# Patient Record
Sex: Female | Born: 1990 | Hispanic: Yes | Marital: Married | Smoking: Never smoker
Health system: Southern US, Community
[De-identification: ages and names within clinical notes are randomized; demographics above are authoritative.]

## PROBLEM LIST (undated history)

## (undated) DIAGNOSIS — O139 Gestational [pregnancy-induced] hypertension without significant proteinuria, unspecified trimester: Secondary | ICD-10-CM

## (undated) HISTORY — PX: BREAST BIOPSY: SHX20

---

## 2013-07-20 NOTE — L&D Delivery Note (Signed)
Delivery Note At 6:51 AM a viable female was delivered via Vaginal, Spontaneous Delivery (Presentation: Left Occiput Anterior).  APGAR: 4, 7; weight 5 lb 4.7 oz (2401 g).   Placenta status: Intact, Spontaneous.  Cord:  with the following complications: .  Cord pH: not done  Anesthesia:   Episiotomy:  Lacerations:  Suture Repair: 2.0 vicryl Est. Blood Loss (mL):   Mom to postpartum.  Baby to Couplet care / Skin to Skin.  MARSHALL,BERNARD A 01/03/2014, 7:16 AM

## 2013-11-16 LAB — OB RESULTS CONSOLE ABO/RH: RH Type: POSITIVE

## 2013-11-16 LAB — OB RESULTS CONSOLE HEPATITIS B SURFACE ANTIGEN: Hepatitis B Surface Ag: NEGATIVE

## 2013-11-16 LAB — OB RESULTS CONSOLE HIV ANTIBODY (ROUTINE TESTING): HIV: NONREACTIVE

## 2013-11-16 LAB — OB RESULTS CONSOLE RPR: RPR: NONREACTIVE

## 2013-11-16 LAB — OB RESULTS CONSOLE GC/CHLAMYDIA
Chlamydia: NEGATIVE
GC PROBE AMP, GENITAL: NEGATIVE

## 2013-11-16 LAB — OB RESULTS CONSOLE RUBELLA ANTIBODY, IGM: Rubella: IMMUNE

## 2013-11-16 LAB — OB RESULTS CONSOLE ANTIBODY SCREEN: ANTIBODY SCREEN: NEGATIVE

## 2013-12-21 ENCOUNTER — Encounter (HOSPITAL_COMMUNITY): Payer: Self-pay | Admitting: General Practice

## 2013-12-21 ENCOUNTER — Inpatient Hospital Stay (HOSPITAL_COMMUNITY)
Admission: AD | Admit: 2013-12-21 | Discharge: 2013-12-21 | Disposition: A | Discharge status: Home / Self Care | Payer: Self-pay | Source: Ambulatory Visit | Attending: Obstetrics | Admitting: Obstetrics

## 2013-12-21 DIAGNOSIS — O9989 Other specified diseases and conditions complicating pregnancy, childbirth and the puerperium: Principal | ICD-10-CM

## 2013-12-21 DIAGNOSIS — R030 Elevated blood-pressure reading, without diagnosis of hypertension: Secondary | ICD-10-CM | POA: Insufficient documentation

## 2013-12-21 DIAGNOSIS — IMO0001 Reserved for inherently not codable concepts without codable children: Secondary | ICD-10-CM

## 2013-12-21 DIAGNOSIS — O99891 Other specified diseases and conditions complicating pregnancy: Secondary | ICD-10-CM | POA: Insufficient documentation

## 2013-12-21 HISTORY — DX: Gestational (pregnancy-induced) hypertension without significant proteinuria, unspecified trimester: O13.9

## 2013-12-21 LAB — URINALYSIS, ROUTINE W REFLEX MICROSCOPIC
Bilirubin Urine: NEGATIVE
Glucose, UA: NEGATIVE mg/dL
Hgb urine dipstick: NEGATIVE
Ketones, ur: NEGATIVE mg/dL
Leukocytes, UA: NEGATIVE
NITRITE: NEGATIVE
Protein, ur: 30 mg/dL — AB
SPECIFIC GRAVITY, URINE: 1.025 (ref 1.005–1.030)
UROBILINOGEN UA: 0.2 mg/dL (ref 0.0–1.0)
pH: 6 (ref 5.0–8.0)

## 2013-12-21 LAB — URINE MICROSCOPIC-ADD ON

## 2013-12-21 LAB — COMPREHENSIVE METABOLIC PANEL
ALK PHOS: 211 U/L — AB (ref 39–117)
ALT: 20 U/L (ref 0–35)
AST: 17 U/L (ref 0–37)
Albumin: 2.6 g/dL — ABNORMAL LOW (ref 3.5–5.2)
BUN: 11 mg/dL (ref 6–23)
CO2: 19 mEq/L (ref 19–32)
CREATININE: 0.48 mg/dL — AB (ref 0.50–1.10)
Calcium: 8.6 mg/dL (ref 8.4–10.5)
Chloride: 108 mEq/L (ref 96–112)
GFR calc non Af Amer: >90 mL/min (ref >90)
GLUCOSE: 90 mg/dL (ref 70–99)
Potassium: 4.5 mEq/L (ref 3.7–5.3)
Sodium: 137 mEq/L (ref 137–147)
Total Bilirubin: <0.2 mg/dL — ABNORMAL LOW (ref 0.3–1.2)
Total Protein: 6.1 g/dL (ref 6.0–8.3)

## 2013-12-21 LAB — PROTEIN / CREATININE RATIO, URINE
Creatinine, Urine: 128.47 mg/dL
Protein Creatinine Ratio: 0.33 — ABNORMAL HIGH (ref 0.00–0.15)
TOTAL PROTEIN, URINE: 42.3 mg/dL

## 2013-12-21 LAB — CBC
HCT: 38.9 % (ref 36.0–46.0)
HEMOGLOBIN: 13.6 g/dL (ref 12.0–15.0)
MCH: 31.2 pg (ref 26.0–34.0)
MCHC: 35 g/dL (ref 30.0–36.0)
MCV: 89.2 fL (ref 78.0–100.0)
Platelets: 225 10*3/uL (ref 150–400)
RBC: 4.36 MIL/uL (ref 3.87–5.11)
RDW: 14.4 % (ref 11.5–15.5)
WBC: 9.9 10*3/uL (ref 4.0–10.5)

## 2013-12-21 LAB — LACTATE DEHYDROGENASE: LDH: 264 U/L — AB (ref 94–250)

## 2013-12-21 LAB — URIC ACID: URIC ACID, SERUM: 4 mg/dL (ref 2.4–7.0)

## 2013-12-21 NOTE — MAU Provider Note (Signed)
History     CSN: [id number]  Arrival date and time: 12/21/13 1440   First Provider Initiated Contact with Patient 12/21/13 1638      Chief Complaint  Patient presents with  . Hypertension   HPI [name] is a 23 y.o. G2P1001 at [redacted]w[redacted]d who presents to MAU today from the office for further evaluation of elevated BP. The patient denies issues with BP in this pregnancy, but does endorse a history of PIH with last pregnancy. She denies headache, blurred vision, RUQ pain, contractions, vaginal bleeding or LOF. She states mild LE edema bilaterally. She reports good fetal movement.   OB History   Grav Para Term Preterm Abortions TAB SAB Ect Mult Living   2 1 1       1       Past Medical History  Diagnosis Date  . Pregnancy induced hypertension     Past Surgical History  Procedure Laterality Date  . Cesarean section      History reviewed. No pertinent family history.  History  Substance Use Topics  . Smoking status: Never Smoker   . Smokeless tobacco: Not on file  . Alcohol Use: No    Allergies: No Known Allergies  Prescriptions prior to admission  Medication Sig Dispense Refill  . acetaminophen-codeine (TYLENOL #3) 300-30 MG per tablet Take 1 tablet by mouth every 4 (four) hours as needed for moderate pain.      . Prenatal Vit-Fe Fumarate-FA (PRENATAL MULTIVITAMIN) TABS tablet Take 1 tablet by mouth at bedtime.      . ranitidine (ZANTAC) 75 MG tablet Take 75 mg by mouth daily.        Review of Systems  Constitutional: Negative for fever and malaise/fatigue.  Eyes: Negative for blurred vision.  Gastrointestinal: Negative for abdominal pain.  Genitourinary:       Neg - vaginal bleeding, LOF  Neurological: Negative for headaches.   Physical Exam   Blood pressure 135/64, pulse 82, temperature 98.1 F (36.7 C), temperature source Oral, resp. rate 18, height 4' 9.48" (1.46 m), weight 212 lb (96.163 kg).  Physical Exam  Constitutional: She is oriented  to person, place, and time. She appears well-developed and well-nourished. No distress.  HENT:  Head: Normocephalic and atraumatic.  Cardiovascular: Normal rate, regular rhythm and normal heart sounds.   Respiratory: Effort normal and breath sounds normal. No respiratory distress.  GI: Soft. She exhibits no distension and no mass. There is no tenderness. There is no rebound and no guarding.  Musculoskeletal: She exhibits edema (2+ pitting edema in the feet bilaterally).  Neurological: She is alert and oriented to person, place, and time. She has normal reflexes.  No clonus  Skin: Skin is warm and dry. No erythema.  Psychiatric: She has a normal mood and affect.   Results for orders placed during the hospital encounter of 12/21/13 (from the past 24 hour(s))  URINALYSIS, ROUTINE W REFLEX MICROSCOPIC     Status: Abnormal   Collection Time    12/21/13  2:55 PM      Result Value Ref Range   Color, Urine YELLOW  YELLOW   APPearance CLEAR  CLEAR   Specific Gravity, Urine 1.025  1.005 - 1.030   pH 6.0  5.0 - 8.0   Glucose, UA NEGATIVE  NEGATIVE mg/dL   Hgb urine dipstick NEGATIVE  NEGATIVE   Bilirubin Urine NEGATIVE  NEGATIVE   Ketones, ur NEGATIVE  NEGATIVE mg/dL   Protein, ur 30 (*) NEGATIVE mg/dL   Urobilinogen,  UA 0.2  0.0 - 1.0 mg/dL   Nitrite NEGATIVE  NEGATIVE   Leukocytes, UA NEGATIVE  NEGATIVE  URINE MICROSCOPIC-ADD ON     Status: Abnormal   Collection Time    12/21/13  2:55 PM      Result Value Ref Range   Squamous Epithelial / LPF MANY (*) RARE   WBC, UA 0-2  <3 WBC/hpf   RBC / HPF 0-2  <3 RBC/hpf   Bacteria, UA MANY (*) RARE   Crystals CA OXALATE CRYSTALS (*) NEGATIVE   Urine-Other STARCH CRYSTALS PRESENT    PROTEIN / CREATININE RATIO, URINE     Status: Abnormal   Collection Time    12/21/13  2:55 PM      Result Value Ref Range   Creatinine, Urine 128.47     Total Protein, Urine 42.3     PROTEIN CREATININE RATIO 0.33 (*) 0.00 - 0.15  CBC     Status: None    Collection Time    12/21/13  4:43 PM      Result Value Ref Range   WBC 9.9  4.0 - 10.5 K/uL   RBC 4.36  3.87 - 5.11 MIL/uL   Hemoglobin 13.6  12.0 - 15.0 g/dL   HCT 16.138.9  09.636.0 - 04.546.0 %   MCV 89.2  78.0 - 100.0 fL   MCH 31.2  26.0 - 34.0 pg   MCHC 35.0  30.0 - 36.0 g/dL   RDW 40.914.4  81.111.5 - 91.415.5 %   Platelets 225  150 - 400 K/uL  COMPREHENSIVE METABOLIC PANEL     Status: Abnormal   Collection Time    12/21/13  4:43 PM      Result Value Ref Range   Sodium 137  137 - 147 mEq/L   Potassium 4.5  3.7 - 5.3 mEq/L   Chloride 108  96 - 112 mEq/L   CO2 19  19 - 32 mEq/L   Glucose, Bld 90  70 - 99 mg/dL   BUN 11  6 - 23 mg/dL   Creatinine, Ser 7.820.48 (*) 0.50 - 1.10 mg/dL   Calcium 8.6  8.4 - 95.610.5 mg/dL   Total Protein 6.1  6.0 - 8.3 g/dL   Albumin 2.6 (*) 3.5 - 5.2 g/dL   AST 17  0 - 37 U/L   ALT 20  0 - 35 U/L   Alkaline Phosphatase 211 (*) 39 - 117 U/L   Total Bilirubin <0.2 (*) 0.3 - 1.2 mg/dL   GFR calc non Af Amer >90  >90 mL/min   GFR calc Af Amer >90  >90 mL/min  URIC ACID     Status: None   Collection Time    12/21/13  4:43 PM      Result Value Ref Range   Uric Acid, Serum 4.0  2.4 - 7.0 mg/dL  LACTATE DEHYDROGENASE     Status: Abnormal   Collection Time    12/21/13  4:43 PM      Result Value Ref Range   LDH 264 (*) 94 - 250 U/L   Patient Vitals for the past 24 hrs:  BP Temp Temp src Pulse Resp Height Weight  12/21/13 1703 135/64 mmHg - - 82 - - -  12/21/13 1632 131/81 mmHg - - 81 - - -  12/21/13 1618 113/70 mmHg - - 85 - - -  12/21/13 1603 127/72 mmHg - - 77 - - -  12/21/13 1548 137/74 mmHg - - 75 - - -  12/21/13 1533 148/94 mmHg - - 83 - - -  12/21/13 1521 137/75 mmHg - - 84 - - -  12/21/13 1519 142/81 mmHg - - 81 - - -  12/21/13 1509 149/83 mmHg 98.1 F (36.7 C) Oral 81 18 4' 9.48" (1.46 m) 212 lb (96.163 kg)   Fetal Monitoring: Baseline: 120 bpm, moderate variability, + accelerations, no decelerations Contractions: None MAU Course   Procedures None  MDM UA, Urine protein/creatinine ratio, CBC, CMP, Uric acid, LDH today Discussed patient, labs and VS with Dr. Clearance Coots. Ok for discharge with precautions. Follow-up with Dr. Gaynell Face on Monday Assessment and Plan  A: Elevated BP in pregnancy  P: Discharge home Pre-eclampsia precautions discussed Patient advised to call the office for an appointment on Monday for follow-up with Dr. Gaynell Face Patient may return to MAU as needed or if her condition were to change or worsen   Freddi Starr, PA-C  12/21/2013, 5:46 PM

## 2013-12-21 NOTE — MAU Note (Signed)
Pt was sent from office for [facility] evaluation

## 2013-12-21 NOTE — Discharge Instructions (Signed)
Hipertensin durante el embarazo (Hypertension During Pregnancy) La hipertensin tambin se denomina presin arterial alta. La presin arterial hace que se mueva la sangre en el cuerpo. A veces, la fuerza que [employer] sangre es demasiado intensa. Cuando est embarazada, esta afeccin se debe controlar atentamente. Puede causar problemas para usted y su beb. CUIDADOS EN EL HOGAR   Cumpla con todos los controles mdicos.  Tome los [employer] como le indic su mdico. Informe a su mdico sobre todos los medicamentos que toma.  Coma muy poca sal.  Haga ejercicios regularmente.  No beba alcohol.  No fume.  No tome bebidas con cafena.  Acustese sobre el lado izquierdo cuando haga reposo. SOLICITE AYUDA DE INMEDIATO SI:  Siente un dolor intenso en el vientre (abdominal).  Nota una hinchazn repentina [employer], los tobillos o la cara.  Aument 4libras (1,8kg) o ms en 1semana.  Vomita) repetidas veces.  Tiene una hemorragia por la vagina.  No siente los movimientos del beb.  Tiene cefalea.  Tiene visin doble o borrosa.  Tiene calambres o espasmos musculares.  Le falta el aire.  Tiene las yemas de los dedos y los labios [place].  Observa sangre en la orina. ASEGRESE DE QUE:  Comprende estas instrucciones.  Controlar su afeccin.  Recibir ayuda de inmediato si no mejora o si empeora. Document Released: 10/21/2010 Document Revised: 04/26/2013 [facility] Patient Information 2014 [place], [place].

## 2014-01-03 ENCOUNTER — Inpatient Hospital Stay (HOSPITAL_COMMUNITY)
Admission: AD | Admit: 2014-01-03 | Discharge: 2014-01-04 | DRG: 775 | Disposition: A | Discharge status: Home / Self Care | Payer: Medicaid Other | Source: Ambulatory Visit | Attending: Obstetrics | Admitting: Obstetrics

## 2014-01-03 ENCOUNTER — Encounter (HOSPITAL_COMMUNITY): Payer: Self-pay

## 2014-01-03 DIAGNOSIS — O34219 Maternal care for unspecified type scar from previous cesarean delivery: Secondary | ICD-10-CM | POA: Diagnosis present

## 2014-01-03 DIAGNOSIS — O479 False labor, unspecified: Secondary | ICD-10-CM | POA: Diagnosis present

## 2014-01-03 LAB — TYPE AND SCREEN
ABO/RH(D): A POS
Antibody Screen: NEGATIVE

## 2014-01-03 LAB — CBC
HCT: 37.4 % (ref 36.0–46.0)
HEMATOCRIT: 40.9 % (ref 36.0–46.0)
HEMOGLOBIN: 13 g/dL (ref 12.0–15.0)
Hemoglobin: 14.6 g/dL (ref 12.0–15.0)
MCH: 31.7 pg (ref 26.0–34.0)
MCH: 31.3 pg (ref 26.0–34.0)
MCHC: 35.7 g/dL (ref 30.0–36.0)
MCHC: 34.8 g/dL (ref 30.0–36.0)
MCV: 88.7 fL (ref 78.0–100.0)
MCV: 89.9 fL (ref 78.0–100.0)
PLATELETS: 244 10*3/uL (ref 150–400)
Platelets: 208 10*3/uL (ref 150–400)
RBC: 4.61 MIL/uL (ref 3.87–5.11)
RBC: 4.16 MIL/uL (ref 3.87–5.11)
RDW: 14.4 % (ref 11.5–15.5)
RDW: 14.5 % (ref 11.5–15.5)
WBC: 15.6 10*3/uL — ABNORMAL HIGH (ref 4.0–10.5)
WBC: 19.3 10*3/uL — ABNORMAL HIGH (ref 4.0–10.5)

## 2014-01-03 LAB — RPR

## 2014-01-03 LAB — ABO/RH: ABO/RH(D): A POS

## 2014-01-03 MED ORDER — OXYTOCIN 10 UNIT/ML IJ SOLN
40.0000 [IU] | INTRAVENOUS | Status: DC
  Filled 2014-01-03: qty 4

## 2014-01-03 MED ORDER — TETANUS-DIPHTH-ACELL PERTUSSIS 5-2.5-18.5 LF-MCG/0.5 IM SUSP
0.5000 mL | Freq: Once | INTRAMUSCULAR | Status: AC
  Administered 2014-01-04: 0.5 mL via INTRAMUSCULAR
  Filled 2014-01-03: qty 0.5

## 2014-01-03 MED ORDER — IBUPROFEN 600 MG PO TABS
600.0000 mg | ORAL_TABLET | Freq: Four times a day (QID) | ORAL | Status: DC
  Administered 2014-01-03 – 2014-01-04 (×2): 600 mg via ORAL
  Filled 2014-01-03 (×4): qty 1

## 2014-01-03 MED ORDER — METHYLERGONOVINE MALEATE 0.2 MG/ML IJ SOLN
INTRAMUSCULAR | Status: AC
  Administered 2014-01-03: 0.2 mg via INTRAMUSCULAR
  Filled 2014-01-03: qty 1

## 2014-01-03 MED ORDER — IBUPROFEN 600 MG PO TABS
600.0000 mg | ORAL_TABLET | Freq: Four times a day (QID) | ORAL | Status: DC | PRN
  Administered 2014-01-03 – 2014-01-04 (×3): 600 mg via ORAL
  Filled 2014-01-03: qty 1

## 2014-01-03 MED ORDER — DIBUCAINE 1 % RE OINT: 1.0000 "application " | TOPICAL_OINTMENT | RECTAL | Status: DC | PRN

## 2014-01-03 MED ORDER — LIDOCAINE HCL (PF) 1 % IJ SOLN
30.0000 mL | INTRAMUSCULAR | Status: DC | PRN
  Administered 2014-01-03: 30 mL via SUBCUTANEOUS
  Filled 2014-01-03: qty 30

## 2014-01-03 MED ORDER — WITCH HAZEL-GLYCERIN EX PADS: 1.0000 "application " | MEDICATED_PAD | CUTANEOUS | Status: DC | PRN

## 2014-01-03 MED ORDER — OXYTOCIN BOLUS FROM INFUSION
500.0000 mL | INTRAVENOUS | Status: DC
  Administered 2014-01-03: 500 mL via INTRAVENOUS

## 2014-01-03 MED ORDER — FERROUS SULFATE 325 (65 FE) MG PO TABS
325.0000 mg | ORAL_TABLET | Freq: Two times a day (BID) | ORAL | Status: DC
  Administered 2014-01-03 – 2014-01-04 (×2): 325 mg via ORAL
  Filled 2014-01-03 (×2): qty 1

## 2014-01-03 MED ORDER — BENZOCAINE-MENTHOL 20-0.5 % EX AERO
1.0000 "application " | INHALATION_SPRAY | CUTANEOUS | Status: DC | PRN
  Administered 2014-01-03: 1 via TOPICAL
  Filled 2014-01-03: qty 56

## 2014-01-03 MED ORDER — PRENATAL MULTIVITAMIN CH
1.0000 | ORAL_TABLET | Freq: Every day | ORAL | Status: DC
  Administered 2014-01-04: 1 via ORAL
  Filled 2014-01-03: qty 1

## 2014-01-03 MED ORDER — OXYTOCIN 40 UNITS IN LACTATED RINGERS INFUSION - SIMPLE MED
62.5000 mL/h | INTRAVENOUS | Status: DC
  Administered 2014-01-03 (×2): 62.5 mL/h via INTRAVENOUS
  Filled 2014-01-03 (×2): qty 1000

## 2014-01-03 MED ORDER — ONDANSETRON HCL 4 MG/2ML IJ SOLN: 4.0000 mg | INTRAMUSCULAR | Status: DC | PRN

## 2014-01-03 MED ORDER — ONDANSETRON HCL 4 MG/2ML IJ SOLN: 4.0000 mg | Freq: Four times a day (QID) | INTRAMUSCULAR | Status: DC | PRN

## 2014-01-03 MED ORDER — OXYCODONE-ACETAMINOPHEN 5-325 MG PO TABS: 1.0000 | ORAL_TABLET | ORAL | Status: DC | PRN

## 2014-01-03 MED ORDER — SENNOSIDES-DOCUSATE SODIUM 8.6-50 MG PO TABS
2.0000 | ORAL_TABLET | ORAL | Status: DC
  Filled 2014-01-03: qty 2

## 2014-01-03 MED ORDER — DIPHENHYDRAMINE HCL 25 MG PO CAPS: 25.0000 mg | ORAL_CAPSULE | Freq: Four times a day (QID) | ORAL | Status: DC | PRN

## 2014-01-03 MED ORDER — METHYLERGONOVINE MALEATE 0.2 MG/ML IJ SOLN
0.2000 mg | Freq: Once | INTRAMUSCULAR | Status: AC
  Administered 2014-01-03: 0.2 mg via INTRAMUSCULAR

## 2014-01-03 MED ORDER — CITRIC ACID-SODIUM CITRATE 334-500 MG/5ML PO SOLN: 30.0000 mL | ORAL | Status: DC | PRN

## 2014-01-03 MED ORDER — SIMETHICONE 80 MG PO CHEW: 80.0000 mg | CHEWABLE_TABLET | ORAL | Status: DC | PRN

## 2014-01-03 MED ORDER — ONDANSETRON HCL 4 MG PO TABS: 4.0000 mg | ORAL_TABLET | ORAL | Status: DC | PRN

## 2014-01-03 MED ORDER — LANOLIN HYDROUS EX OINT: TOPICAL_OINTMENT | CUTANEOUS | Status: DC | PRN

## 2014-01-03 MED ORDER — FLEET ENEMA 7-19 GM/118ML RE ENEM
1.0000 | ENEMA | RECTAL | Status: DC | PRN
Start: 2014-01-03 — End: 2014-01-03

## 2014-01-03 MED ORDER — ACETAMINOPHEN 325 MG PO TABS: 650.0000 mg | ORAL_TABLET | ORAL | Status: DC | PRN

## 2014-01-03 MED ORDER — LACTATED RINGERS IV SOLN: 500.0000 mL | INTRAVENOUS | Status: DC | PRN

## 2014-01-03 MED ORDER — ZOLPIDEM TARTRATE 5 MG PO TABS: 5.0000 mg | ORAL_TABLET | Freq: Every evening | ORAL | Status: DC | PRN

## 2014-01-03 MED ORDER — METHYLERGONOVINE MALEATE 0.2 MG PO TABS
0.2000 mg | ORAL_TABLET | Freq: Four times a day (QID) | ORAL | Status: DC
  Administered 2014-01-03 – 2014-01-04 (×4): 0.2 mg via ORAL
  Filled 2014-01-03 (×4): qty 1

## 2014-01-03 MED ORDER — LACTATED RINGERS IV SOLN: INTRAVENOUS | Status: DC

## 2014-01-03 MED ORDER — OXYTOCIN 10 UNIT/ML IJ SOLN
INTRAMUSCULAR | Status: AC
  Administered 2014-01-03: 10 [IU] via INTRAMUSCULAR
  Filled 2014-01-03: qty 1

## 2014-01-03 NOTE — H&P (Signed)
This is Dr. Francoise CeoBernard Marshall dictating the history and physical on  Briana Shelton she is a 23 year old gravida 2 para 75100 I'm him with a previous C[place] in [place] 37 weeks 3 days due 75 negative GBS patient was admitted fully dilated her membranes ruptured at 5 AM and the fluid was clear she had a normal vaginal delivery of a female Apgar of 4 and 7 with a tight nuchal cord cut prior to delivery she had first degree him perineal which was repaired with 2-0 Vicryl placenta was spontaneous Past medical history uneventful pregnancy Past surgical history she had a C-section in [place] Social history negative System review negative Physical exam obese female in no distress HEENT negative Lungs clear to P&A Heart regular rhythm no murmurs murmurs no gallops Abdomen uterus 20 week postpartum size Extremities negative

## 2014-01-03 NOTE — Lactation Note (Signed)
This note was copied from the chart of Briana Shelton. Lactation Consultation Note    Initial consult with this mom and baby, now 8 hours post partum. Mom had a PPH of about 600 mls. She was not able to get her first baby to latch. This baby is an early  term at 5737 3/[redacted] weeks gestation.  She weighs 5 lbs 4.7 oz. I assisted mom with latching baby, but mom has flat nipples, and baby is small. I fitted mom wiostrum, after this, I was not able to hand express any additional . Mom demonstrated hadn expression with good technique. Mom then fed 7 mls of 22 cal Neosure formula , and the feeding increase chart was shown to parents, and instructed to start a new day at 8 am.  The plan is for mom ot pump, feed what she expressed, attempt breast feeding if she wants, but limit to about 10 minutes(with NS), and then formula feed. If paarents get too tired tonight, i suggested dad feed baby formula, and mom pump, and feed EBM with next feeding. Spanish interpreter Briana Shelton, was in for the full consult. Mom and dad know how to call for questions/concerns, as needed. [facility].  Patient Name: Briana Shelton Reason for consult: Initial assessment;NICU baby   Maternal Data Formula Feeding for Exclusion: No Infant to breast within first hour of birth: No Breastfeeding delayed due to:: Maternal status Has patient been taught Hand Expression?: Yes Does the patient have breastfeeding experience prior to this delivery?: Yes  Feeding Feeding Type: Breast Milk Length of feed: 10 min  LATCH Score/Interventions Latch: Repeated attempts needed to sustain latch, nipple held in mouth throughout feeding, stimulation needed to elicit sucking reflex. (baby latched shallow with 16 nipple shield, but strong sucks, no milk seen in shield) Intervention(s): Adjust position;Assist with latch;Breast massage;Breast  compression  Audible Swallowing: None  Type of Nipple: Flat  Comfort (Breast/Nipple): Soft / non-tender     Hold (Positioning): Assistance needed to correctly position infant at breast and maintain latch. Intervention(s): Breastfeeding basics reviewed;Support Pillows;Position options;Skin to skin  LATCH Score: 5  Lactation Tools Discussed/Used Tools: Nipple Shields Nipple shield size: 16 WIC Program: Yes (info faxed for DEP - mom also to call to add baby) Pump Review: Setup, frequency, and cleaning;Milk Storage;Other (comment) (premie setting, hand expression, feeding plan) Initiated by:: clee RN LC at 8 hours post partum Date initiated:: 01/03/14   Consult Status Consult Status: Follow-up Date: 01/04/14 Follow-up type: In-patient    Briana Shelton, Briana Shelton, 4:02 PM

## 2014-01-03 NOTE — Progress Notes (Signed)
The patient arrived to me at 1000am. Upon fundal assesement she was trickling. The Land D RN had stated she will let Dr. Gaynell FaceMarshall know that she is still trickling and to see if he wanted any further treatment. I got the patient up to void at 1010 she did well no dizzyness or clots noted. LR with 40 of pit was ordered at 62.5\hr until the am. DR. Gaynell FaceMarshall arrived at 1200 for vaginal exam and expressed a handfull of small clots out of patient and stated that the patient was ok. Will continue to  Monitor patient for bleeding.

## 2014-01-03 NOTE — H&P (Signed)
Briana Shelton is a 23 y.o. female presenting for SROM and UC's. Maternal Medical History:  Reason for admission: Rupture of membranes and contractions.   Fetal activity: Perceived fetal activity is normal.   Last perceived fetal movement was within the past hour.    Prenatal Complications - Diabetes: none.    OB History   Grav Para Term Preterm Abortions TAB SAB Ect Mult Living   2 1 1       1      Past Medical History  Diagnosis Date  . Pregnancy induced hypertension    Past Surgical History  Procedure Laterality Date  . Cesarean section     Family History: family history is not on file. Social History:  reports that she has never smoked. She does not have any smokeless tobacco history on file. She reports that she does not drink alcohol or use illicit drugs.   Prenatal Transfer Tool  Maternal Diabetes: No Genetic Screening: Normal Maternal Ultrasounds/Referrals: Declined Fetal Ultrasounds or other Referrals:  None Maternal Substance Abuse:  No Significant Maternal Medications:  None Significant Maternal Lab Results:  None Other Comments:  None  Review of Systems  All other systems reviewed and are negative.   Dilation: 6 Effacement (%): 80 Station: -3 Exam by:: Judeth HornErin Lawrence RNC Blood pressure 146/94, pulse 71. Maternal Exam:  Uterine Assessment: Contraction strength is firm.  Abdomen: Patient reports no abdominal tenderness. Fetal presentation: vertex  Introitus: Normal vulva. Normal vagina.  Cervix: Cervix evaluated by digital exam.     Physical Exam  Nursing note and vitals reviewed. Constitutional: She is oriented to person, place, and time. She appears well-developed and well-nourished.  HENT:  Head: Normocephalic and atraumatic.  Eyes: Conjunctivae are normal. Pupils are equal, round, and reactive to light.  Neck: Normal range of motion. Neck supple.  Cardiovascular: Normal rate and regular rhythm.   Respiratory: Effort normal.  GI:  Soft.  Musculoskeletal: Normal range of motion.  Neurological: She is alert and oriented to person, place, and time.  Skin: Skin is warm and dry.  Psychiatric: She has a normal mood and affect. Her behavior is normal. Judgment and thought content normal.    Prenatal labs: ABO, Rh:   Antibody:   Rubella:   RPR:    HBsAg:    HIV:    GBS:     Assessment/Plan: 37.3 weeks.  VBAC.  Active labor.  Admit.   HARPER,CHARLES A 01/03/2014, 6:22 AM

## 2014-01-04 ENCOUNTER — Ambulatory Visit: Payer: Self-pay

## 2014-01-04 LAB — CBC
HCT: 33 % — ABNORMAL LOW (ref 36.0–46.0)
Hemoglobin: 11.1 g/dL — ABNORMAL LOW (ref 12.0–15.0)
MCH: 30.6 pg (ref 26.0–34.0)
MCHC: 33.6 g/dL (ref 30.0–36.0)
MCV: 90.9 fL (ref 78.0–100.0)
Platelets: 166 10*3/uL (ref 150–400)
RBC: 3.63 MIL/uL — AB (ref 3.87–5.11)
RDW: 14.8 % (ref 11.5–15.5)
WBC: 12.7 10*3/uL — AB (ref 4.0–10.5)

## 2014-01-04 NOTE — Discharge Summary (Signed)
Obstetric Discharge Summary Reason for Admission: onset of labor Prenatal Procedures: none Intrapartum Procedures: spontaneous vaginal delivery Postpartum Procedures: none Complications-Operative and Postpartum: none Hemoglobin  Date Value Ref Range Status  01/04/2014 11.1* 12.0 - 15.0 g/dL Final     HCT  Date Value Ref Range Status  01/04/2014 33.0* 36.0 - 46.0 % Final    Physical Exam:  General: alert Lochia: appropriate Uterine Fundus: firm Incision: healing well DVT Evaluation: No evidence of DVT seen on physical exam.  Discharge Diagnoses: Term Pregnancy-delivered  Discharge Information: Date: 01/04/2014 Activity: pelvic rest Diet: routine Medications: Percocet Condition: improved Instructions: refer to practice specific booklet Discharge to: home Follow-up Information   Follow up with Kathreen CosierMARSHALL,BERNARD A, MD.   Specialty:  Obstetrics and Gynecology   Contact information:   [address] [place] [address] [phone]       Newborn Data: Live born female  Birth Weight: 5 lb 4.7 oz (2401 g) APGAR: 4, 7  Home with mother.  MARSHALL,BERNARD A 01/04/2014, 6:58 AM

## 2014-01-04 NOTE — Discharge Instructions (Signed)
Discharge instructions   You can wash your hair  Shower  Eat what you want  Drink what you want  See me in 6 weeks  Your ankles are going to swell more in the next 2 weeks than when pregnant  No sex for 6 weeks   Briana Shelton A, MD 01/04/2014

## 2014-01-04 NOTE — Lactation Note (Signed)
This note was copied from the chart of Briana Fabiola Shelton. Lactation Consultation Note  Patient Name: Briana Shelton UEAVW'UToday's Date: 01/04/2014 Reason for consult: Follow-up assessment;Breast/nipple pain.  RN had requested comfort gelpads earlier today and provided these to patient.  At time of LC visit, mom resting but FOB speaks English and reports that mom has been following recommendations of previous LC:  Breastfeeding first, pumping with DEBP and feeding either ebm or formula after breastfeeding, as needed.  FOB is staying tonight, so LC encouraged him to assist mom with continuing plan.   Maternal Data    Feeding    LATCH Score/Interventions        LATCH score=6 at 1500 feeding, per RN, due to need for repeated assistance, stimulation and limited swallows              Lactation Tools Discussed/Used Tools: Comfort gels   Consult Status Consult Status: Follow-up Date: 01/05/14 Follow-up type: In-patient    Warrick ParisianBryant, Joanne [facility] 01/04/2014, 10:18 PM

## 2014-01-04 NOTE — Progress Notes (Signed)
UR chart review completed.  

## 2014-01-04 NOTE — Lactation Note (Signed)
This note was copied from the chart of Briana Shelton. Lactation Consultation Note  Patient Name: Briana Shelton ZOXWR'UToday's Date: 01/04/2014 Reason for consult: Follow-up assessment Baby 30 hours of life. Assisted by in-house Spanish interpreter, Kandis CockingMaritza. Mom reports that she has just BF baby 20 minutes and is now supplementing baby with formula in bottle. Mom reports that she was able to obtain EBM with DEBP yesterday, but is not able to get anything today. Mom reports that her first baby was early and little also, and her milk came in day 3. Mom reports that she used a syringe to pump and help evert her nipple with first baby, and did not use a NS. Gave mom a hand pump to assist with everting her nipple. Assisted mom to hand express, colostrum expressed from both breast. Plan is for mom to offer lots of STS, massage and hand express prior to offering breast to infant with cues and at least 8-12 times/24 hours. After baby nurses, mom uses DEBP 15 minutes while FOB offers baby bottle and mom watches baby not pump, then hand express after pumping. Enc mom to try to limit each feeding to 30 minutes not to tire baby out. Discussed plan with patient's MBU RN, and other LC as enc parents [facility] latch when baby since baby was receiving bottle at this assessment.   Maternal Data    Feeding Feeding Type:  (Mom supplementing baby with bottle.)  LATCH Score/Interventions Latch: Repeated attempts needed to sustain latch, nipple held in mouth throughout feeding, stimulation needed to elicit sucking reflex. Intervention(s): Adjust position  Audible Swallowing: A few with stimulation  Type of Nipple: Flat  Comfort (Breast/Nipple): Soft / non-tender     Hold (Positioning): Assistance needed to correctly position infant at breast and maintain latch.  LATCH Score: 6  Lactation Tools Discussed/Used Tools: Pump Breast pump type: Manual   Consult  Status Consult Status: Follow-up Date: 01/05/14 Follow-up type: In-patient    Geralynn OchsWILLIARD, JENNIFER 01/04/2014, 12:54 PM

## 2014-01-05 ENCOUNTER — Ambulatory Visit: Payer: Self-pay

## 2014-01-05 NOTE — Lactation Note (Addendum)
This note was copied from the chart of Girl Fabiola Hernandez-Parada. Lactation Consultation Note With Dad interpreting. Attempt to latch baby but too sleepy- would take a few sucks then go off to sleep. Reports that baby had about 50 cc's of formula at the last feeding. Mom has called WIC and plans to get pump from them. No questions at present. Concerned that she is not getting enough milk. Encouragement given to pump if baby does not nurse to promote milk supply. To call prn.  Patient Name: Girl Sindy GuadeloupeFabiola Hernandez-Parada ZOXWR'UToday's Date: 01/05/2014 Reason for consult: Follow-up assessment;Late preterm infant   Maternal Data    Feeding Feeding Type: Breast Fed Length of feed: 5 min  LATCH Score/Interventions Latch: Repeated attempts needed to sustain latch, nipple held in mouth throughout feeding, stimulation needed to elicit sucking reflex.  Audible Swallowing: None  Type of Nipple: Everted at rest and after stimulation  Comfort (Breast/Nipple): Soft / non-tender     Hold (Positioning): Assistance needed to correctly position infant at breast and maintain latch. Intervention(s): Breastfeeding basics reviewed;Support Pillows  LATCH Score: 6  Lactation Tools Discussed/Used WIC Program: Yes   Consult Status Consult Status: Complete    Pamelia HoitWeeks, Sharifah Champine D 01/05/2014, 8:55 AM

## 2014-01-06 ENCOUNTER — Ambulatory Visit: Payer: Self-pay

## 2014-01-06 NOTE — Lactation Note (Signed)
This note was copied from the chart of Briana Fabiola Shelton. Lactation Consultation Note  Patient Name: Briana Shelton RUEAV'WToday's Date: 01/06/2014 Reason for consult: Follow-up assessment;Infant < 6lbs Shanda BumpsJessica, the Spanish interpreter present for visit. Mom and FOB report baby is latching well to both breasts using the #16 nipple shield. Mom reports her breasts are beginning to fill. She is using the hand pump to pre-pump. Mom is breast and bottle feeding. She reports her plan is to BF with each feeding, then pump and give baby back EBM, using formula as needed to satisfy baby. LC encouraged Mom to be sure baby is at the breast 8-12 times in 24 hours, keep baby actively nursing for 15-20 minutes each breast if possible, then post pump and continue to give EBM back to baby till back to birth weight. Mom can apply nipple shield independently. [facility] parents [facility]. Engorgement care reviewed if needed. Advised of OP f/u and recommend scheduling an appointment. Parents will call if want appt. Baby latched well to the left breast this visit with #16 nipple shield. Took a few minutes to organize her suck but once she developed a good suckling pattern, audible swallows were present and Mom's breast milk present in nipple shield. Baby sleepy when tried to latch to right breast and would not suckle. Continue plan as above.   Maternal Data    Feeding Feeding Type: Breast Fed Length of feed: 10 min  LATCH Score/Interventions Latch: Grasps breast easily, tongue down, lips flanged, rhythmical sucking. (using #16 nipple shield)  Audible Swallowing: Spontaneous and intermittent  Type of Nipple: Flat Intervention(s): Hand pump;Double electric pump  Comfort (Breast/Nipple): Filling, red/small blisters or bruises, mild/mod discomfort  Problem noted: Filling  Hold (Positioning): Assistance needed to correctly position infant at breast and maintain  latch. Intervention(s): Breastfeeding basics reviewed;Support Pillows;Position options;Skin to skin  LATCH Score: 7  Lactation Tools Discussed/Used Nipple shield size: 16 Breast pump type: Double-Electric Breast Pump   Consult Status Consult Status: Complete Date: 01/06/14 Follow-up type: In-patient    Alfred LevinsGranger, Kathy Ann 01/06/2014, 12:05 PM

## 2014-05-21 ENCOUNTER — Encounter (HOSPITAL_COMMUNITY): Payer: Self-pay

## 2016-01-22 ENCOUNTER — Ambulatory Visit (HOSPITAL_COMMUNITY)
Admission: EM | Admit: 2016-01-22 | Discharge: 2016-01-22 | Disposition: A | Discharge status: Home / Self Care | Payer: Self-pay | Attending: Emergency Medicine | Admitting: Emergency Medicine

## 2016-01-22 ENCOUNTER — Encounter (HOSPITAL_COMMUNITY): Payer: Self-pay | Admitting: Emergency Medicine

## 2016-01-22 DIAGNOSIS — J0100 Acute maxillary sinusitis, unspecified: Secondary | ICD-10-CM

## 2016-01-22 MED ORDER — AMOXICILLIN-POT CLAVULANATE 875-125 MG PO TABS
1.0000 | ORAL_TABLET | Freq: Two times a day (BID) | ORAL | Status: DC
Start: 2016-01-22 — End: 2019-12-13

## 2016-01-22 NOTE — Discharge Instructions (Signed)
Sinusitis en adultos (Sinusitis, Adult) La sinusitis es el enrojecimiento, el dolor y la inflamacin de los senos paranasales. Los senos paranasales son cavidades de aire que estn dentro de los huesos de la cara. Se encuentran debajo de los ojos, en la mitad de la frente y encima de los ojos. En los senos paranasales sanos, el moco puede drenar y el aire circula a travs de ellos en su camino hacia la Clinical cytogeneticistnariz. Sin embargo, cuando se Socasteeinflaman, el moco y el aire Marionquedan atrapados. Esto hace que se desarrollen bacterias y otros grmenes que causan infeccin. La sinusitis puede desarrollarse rpidamente y durar solo un tiempo corto (aguda) o continuar por un perodo largo (crnica). La sinusitis que dura ms de 12 semanas se considera crnica. CAUSAS Las causas de la sinusitis son:  GladstoneAlergias.  Las Liz Claiborneanomalas estructurales, como el desplazamiento del cartlago que separa las fosas nasales (desvo del tabique), que puede disminuir el flujo de aire por la nariz y los senos paranasales, y Audiological scientistafectar su drenaje.  Las anomalas funcionales, como cuando los pequeos pelos (cilias) que se encuentran en los senos paranasales y ayudan a eliminar el moco no funcionan correctamente o no estn presentes. SIGNOS Y SNTOMAS Los sntomas de la sinusitis aguda y Myanmarcrnica son los mismos. Los sntomas principales son Chief Technology Officerel dolor y la presin alrededor de los senos paranasales afectados. Otros sntomas son:  Careers information officerDolor en los dientes superiores.  Dolor de odos.  Dolor de Turkmenistancabeza.  Mal aliento.  Disminucin del sentido del olfato y del gusto.  Tos, que empeora al D.R. Horton, Incacostarse.  Fatiga.  Grant RutsFiebre.  Drenaje de moco espeso por la nariz, que generalmente es de color verde y puede contener pus (purulento).  Hinchazn y calor en los senos paranasales afectados. DIAGNSTICO Su mdico le Medical sales representativerealizar un examen fsico. Durante el examen, el mdico puede hacer cualquiera de estas cosas para determinar si usted tiene sinusitis aguda o  crnica:  Le revisar la nariz para buscar signos de crecimientos anormales en las fosas nasales (plipos nasales).  Palpar los senos paranasales afectados para buscar signos de infeccin.  Observar la parte interna de los senos paranasales con un dispositivo que tiene una luz (endoscopio). Si el mdico sospecha que usted sufre sinusitis crnica, podr indicar una o ms de las siguientes pruebas:  Pruebas de Programmer, multimediaalergia.  Cultivo de las Yahoosecreciones nasales. Se extrae Lauris Poaguna muestra de moco de la nariz, que se enva al laboratorio para detectar bacterias.  Citologa nasal. Se extrae Lauris Poaguna muestra de moco de la nariz, que el mdico examina para determinar si la sinusitis est relacionada con Vella Raringuna alergia. TRATAMIENTO La mayora de los casos de sinusitis aguda se deben a una infeccin viral y se resuelven espontneamente en un perodo de 10das. A veces, se recetan medicamentos como ayuda para Eastman Kodakaliviar los sntomas tanto de la sinusitis aguda como de la crnica. Estos pueden incluir analgsicos, descongestivos, aerosoles nasales con corticoides o aerosoles de solucin salina. Sin embargo, para la sinusitis por infeccin bacteriana, Chief Operating Officerel mdico le recetar antibiticos. Los antibiticos son medicamentos que destruyen las bacterias que causan la infeccin. Con poca frecuencia, la sinusitis tiene su origen en una infeccin por hongos. En estos casos, Actorel mdico le recetar un medicamento antimictico. Para algunos casos de sinusitis crnica, es necesario someterse a [place] and Herzegovinauna ciruga. Generalmente, se trata de Engelhard Corporationcasos en los que la sinusitis se repite ms de 3veces al ao, a pesar de otros tratamientos. INSTRUCCIONES PARA EL CUIDADO EN EL HOGAR  Beber abundante agua. Los lquidos ayudan a Restaurant manager, fast fooddisolver  el moco, para que drene ms fcilmente de los senos paranasales.  Use un humidificador.  Inhale vapor de 3a 4veces al da (por ejemplo, sintese en el bao con la [place]).  Aplquese un pao tibio y hmedo en  la cara 3 o 4veces al da o segn las indicaciones del mdico.  Use un aerosol nasal salino para ayudar a Environmental education officer y [employer] senos nasales.  Tome los medicamentos solamente como se lo haya indicado el mdico.  Si le recetaron un antimictico o un antibitico, asegrese de terminarlos, incluso si comienza a sentirse mejor. SOLICITE ATENCIN MDICA DE INMEDIATO SI:  Siente ms dolor o sufre dolores de cabeza intensos.  Tiene nuseas, vmitos o somnolencia.  Observa hinchazn alrededor del rostro.  Tiene problemas de visin.  Presenta rigidez en el cuello.  Tiene dificultad para respirar.   Esta informacin no tiene Theme park manager el consejo del mdico. Asegrese de hacerle al mdico cualquier pregunta que tenga.   Document Released: 04/15/2005 Document Revised: 07/27/2014 Elsevier Interactive Patient Education 2016 [employer].  Dolor de cabeza general sin causa (General Headache Without Cause) El dolor de cabeza es un dolor o Dentist que se siente en la zona de la cabeza o del cuello. Puede no tener una causa especfica. Hay muchas causas y tipos de dolores de [place]. Los dolores de cabeza ms comunes son los siguientes:  Cefalea tensional.  Cefaleas migraosas.  Cefalea en brotes.  Cefaleas diarias crnicas. INSTRUCCIONES PARA EL CUIDADO EN EL HOGAR  Controle su afeccin para ver si hay cambios. Siga estos pasos para Scientist, physiological afeccin: Control del [employer] medicamentos de venta libre y los recetados solamente como se lo haya indicado el mdico.  Cuando sienta dolor de cabeza acustese en un cuarto oscuro y tranquilo.  Si se lo indican, aplique hielo sobre la cabeza y la zona del cuello:  Ponga el hielo en una bolsa plstica.  Coloque una toalla entre la piel y la bolsa de hielo.  Coloque el hielo durante , 2 a 3veces por Futures trader.  Utilice una almohadilla trmica o tome una ducha con agua caliente para aplicar calor en la cabeza y la  zona del cuello como se lo haya indicado el mdico.  Mantenga las luces tenues si le Liz Claiborne luces brillantes o sus dolores de cabeza empeoran. Comida y bebida  Mantenga un horario para las comidas.  Limite el consumo de bebidas alcohlicas.  Consuma menos cantidad de cafena o deje de tomarla. Instrucciones generales  Concurra a todas las visitas de control como se lo haya indicado el mdico. Esto es importante.  Lleve un diario de los dolores de cabeza para Financial risk analyst qu factores pueden desencadenarlos. Por ejemplo, escriba los siguientes datos:  Lo que usted come y Estate agent.  Cunto tiempo duerme.  Algn cambio en su dieta o en los medicamentos.  Pruebe algunas tcnicas de relajacin, como los [place].  Limite el estrs.  Sintese con la espalda recta y no tense los msculos.  No consuma productos que contengan tabaco, incluidos cigarrillos, tabaco de Theatre manager o cigarrillos electrnicos. Si necesita ayuda para dejar de fumar, consulte al mdico.  Haga actividad fsica habitualmente como se lo haya indicado el mdico.  Tenga un horario fijo para dormir. Duerma entre 7 y 9horas o la cantidad de horas que le haya recomendado el mdico. SOLICITE ATENCIN MDICA SI:   Los medicamentos no Materials engineer los sntomas.  Tiene un dolor de cabeza que es diferente del dolor de  cabeza habitual.  Tiene nuseas o vmitos.  Tiene fiebre. SOLICITE ATENCIN MDICA DE INMEDIATO SI:   El dolor se hace cada vez ms intenso.  Ha vomitado repetidas veces.  Presenta rigidez en el cuello.  Sufre prdida de la visin.  Tiene problemas para hablar.  Siente dolor en el ojo o en el odo.  Presenta debilidad muscular o prdida del control muscular.  Pierde el equilibrio o tiene problemas para Advertising account plannercaminar.  Sufre mareos o se desmaya.  Se siente confundido.   Esta informacin no tiene Theme park managercomo fin reemplazar el consejo del mdico. Asegrese de hacerle al mdico cualquier pregunta que  tenga.   Document Released: 04/15/2005 Document Revised: 03/27/2015 Elsevier Interactive Patient Education Yahoo[employer]2016 Elsevier Inc.

## 2016-01-22 NOTE — ED Notes (Signed)
[facility] a [facility] and a headache x 1 week.

## 2016-01-22 NOTE — ED Provider Notes (Signed)
CSN: [id number]     Arrival date & time 01/22/16  0957 History   First MD Initiated Contact with Patient 01/22/16 1021     Chief Complaint  Patient presents with  . Nasal Congestion  . Headache   (Consider location/radiation/quality/duration/timing/severity/associated sxs/prior Treatment) HPI History obtained from patient/with Spanish line interpreter Location:  Sinuses Context/Duration: Onset about 2 weeks ago after a cold  Severity: 4  Quality: Constant pressure Timing:          Constant  Home Treatment: Over-the-counter medications without relief of symptoms Associated symptoms:  Headache Family History: Hypertension    Past Medical History  Diagnosis Date  . Pregnancy induced hypertension    Past Surgical History  Procedure Laterality Date  . Cesarean section     History reviewed. No pertinent family history. Social History  Substance Use Topics  . Smoking status: Never Smoker   . Smokeless tobacco: None  . Alcohol Use: No   OB History    Gravida Para Term Preterm AB TAB SAB Ectopic Multiple Living   2 2 2       2      Review of Systems  Denies: HEADACHE, NAUSEA, ABDOMINAL PAIN, CHEST PAIN, CONGESTION, DYSURIA, SHORTNESS OF BREATH  Allergies  Review of patient's allergies indicates no known allergies.  Home Medications   Prior to Admission medications   Medication Sig Start Date End Date Taking? Authorizing Provider  amoxicillin-clavulanate (AUGMENTIN) 875-125 MG tablet Take 1 tablet by mouth every 12 (twelve) hours. 01/22/16   Tharon AquasFrank C Marita Burnsed, PA   Meds Ordered and Administered this Visit  Medications - No data to display  BP 138/82 mmHg  Pulse 98  Temp(Src) 97.7 F (36.5 C) (Oral)  Resp 18  SpO2 99%  Breastfeeding? No No data found.   Physical Exam NURSES NOTES AND VITAL SIGNS REVIEWED. CONSTITUTIONAL: Well developed, well nourished, no acute distress HEENT: normocephalic, atraumatic, left maxillary sinus exquisitely tender to palpation. There is  bogginess of the left turbinate. Decreased transillumination. Patient has nasal occlusion on the left. It is also a small amount of postnasal drainage noted. EYES: Conjunctiva normal NECK:normal ROM, supple, no adenopathy PULMONARY:No respiratory distress, normal effort ABDOMINAL: Soft, ND, NT BS+, No CVAT MUSCULOSKELETAL: Normal ROM of all extremities,  SKIN: warm and dry without rash PSYCHIATRIC: Mood and affect, behavior are normal   ED Course  Procedures (including critical care time)  Labs Review Labs Reviewed - No data to display  Imaging Review No results found.   Visual Acuity Review  Right Eye Distance:   Left Eye Distance:   Bilateral Distance:    Right Eye Near:   Left Eye Near:    Bilateral Near:       RX augmentin  MDM   1. Acute maxillary sinusitis, recurrence not specified     Patient is reassured that there are no issues that require transfer to higher level of care at this time or additional tests. Patient is advised to continue home symptomatic treatment. Patient is advised that if there are new or worsening symptoms to attend the emergency department, contact primary care provider, or return to UC. Instructions of care provided discharged home in stable condition.    THIS NOTE WAS GENERATED USING A VOICE RECOGNITION SOFTWARE PROGRAM. ALL REASONABLE EFFORTS  WERE MADE TO PROOFREAD THIS DOCUMENT FOR ACCURACY.  I have verbally reviewed the discharge instructions with the patient. A printed AVS was given to the patient.  All questions were answered prior to discharge.  Tharon AquasFrank C Yarlin Breisch, PA 01/22/16 1400

## 2016-07-17 ENCOUNTER — Ambulatory Visit (HOSPITAL_COMMUNITY)
Admission: EM | Admit: 2016-07-17 | Discharge: 2016-07-17 | Disposition: A | Discharge status: Home / Self Care | Payer: Self-pay | Attending: Family Medicine | Admitting: Family Medicine

## 2016-07-17 ENCOUNTER — Encounter (HOSPITAL_COMMUNITY): Payer: Self-pay | Admitting: Emergency Medicine

## 2016-07-17 DIAGNOSIS — J0100 Acute maxillary sinusitis, unspecified: Secondary | ICD-10-CM

## 2016-07-17 MED ORDER — AMOXICILLIN 500 MG PO CAPS: 500.0000 mg | ORAL_CAPSULE | Freq: Three times a day (TID) | ORAL | 0 refills | Status: DC

## 2016-07-17 NOTE — ED Triage Notes (Signed)
Here for constant HA onset 1 week associated w/bilateral ear pain, nasal congestion  Denies fevers  Using Afrin w/no relief  A&O x4... NAD

## 2016-07-17 NOTE — ED Provider Notes (Signed)
CSN: [id number]     Arrival date & time 07/17/16  1630 History   None    Chief Complaint  Patient presents with  . Headache   (Consider location/radiation/quality/duration/timing/severity/associated sxs/prior Treatment) The history is provided by the patient. No language interpreter was used.  Headache  Pain location:  Generalized Radiates to:  Face Timing:  Constant Progression:  Worsening Chronicity:  New Context: coughing   Ineffective treatments:  None tried Associated symptoms: sinus pressure   Pt complains of a cough and sinus pressure.  Pt feels like she has a sinus infection  Past Medical History:  Diagnosis Date  . Pregnancy induced hypertension    Past Surgical History:  Procedure Laterality Date  . CESAREAN SECTION     No family history on file. Social History  Substance Use Topics  . Smoking status: Never Smoker  . Smokeless tobacco: Never Used  . Alcohol use No   OB History    Gravida Para Term Preterm AB Living   2 2 2     2    SAB TAB Ectopic Multiple Live Births           2     Review of Systems  HENT: Positive for sinus pressure.   Neurological: Positive for headaches.  All other systems reviewed and are negative.   Allergies  Patient has no known allergies.  Home Medications   Prior to Admission medications   Medication Sig Start Date End Date Taking? Authorizing Provider  amoxicillin (AMOXIL) 500 MG capsule Take 1 capsule (500 mg total) by mouth 3 (three) times daily. 07/17/16   Elson AreasLeslie K Creedon Danielski, PA-C  amoxicillin-clavulanate (AUGMENTIN) 875-125 MG tablet Take 1 tablet by mouth every 12 (twelve) hours. 01/22/16   Tharon AquasFrank C Patrick, PA   Meds Ordered and Administered this Visit  Medications - No data to display  BP 114/84 (BP Location: Right Arm)   Pulse 87   Temp 98.8 F (37.1 C) (Oral)   SpO2 100%  No data found.   Physical Exam  Constitutional: She is oriented to person, place, and time. She appears well-developed and  well-nourished.  HENT:  Head: Normocephalic and atraumatic.  Mouth/Throat: Posterior oropharyngeal erythema present.  Tender maxillary sinuses,  Eyes: Conjunctivae and EOM are normal. Pupils are equal, round, and reactive to light.  Neck: Normal range of motion. Neck supple.  Pulmonary/Chest: Effort normal.  Abdominal: Soft.  Musculoskeletal: Normal range of motion.  Neurological: She is alert and oriented to person, place, and time.  Skin: Skin is warm and dry.  Psychiatric: She has a normal mood and affect.    Urgent Care Course   Clinical Course     Procedures (including critical care time)  Labs Review Labs Reviewed - No data to display  Imaging Review No results found.   Visual Acuity Review  Right Eye Distance:   Left Eye Distance:   Bilateral Distance:    Right Eye Near:   Left Eye Near:    Bilateral Near:         MDM   1. Acute maxillary sinusitis, recurrence not specified    Meds ordered this encounter  Medications  . amoxicillin (AMOXIL) 500 MG capsule    Sig: Take 1 capsule (500 mg total) by mouth 3 (three) times daily.    Dispense:  30 capsule    Refill:  0    Order Specific Question:   Supervising Provider    Answer:   Linna HoffKINDL, JAMES D [phone]]  An After  Visit Summary was printed and given to the patient.    Lonia SkinnerLeslie K PaloSofia, PA-C 07/17/16 [phone]

## 2017-02-17 ENCOUNTER — Encounter (HOSPITAL_COMMUNITY): Payer: Self-pay

## 2019-07-04 ENCOUNTER — Other Ambulatory Visit (HOSPITAL_COMMUNITY): Payer: Self-pay | Admitting: *Deleted

## 2019-07-04 DIAGNOSIS — N6320 Unspecified lump in the left breast, unspecified quadrant: Secondary | ICD-10-CM

## 2019-07-06 ENCOUNTER — Other Ambulatory Visit (HOSPITAL_COMMUNITY): Payer: Self-pay | Admitting: Obstetrics and Gynecology

## 2019-07-06 ENCOUNTER — Ambulatory Visit
Admission: RE | Admit: 2019-07-06 | Discharge: 2019-07-06 | Disposition: A | Discharge status: Home / Self Care | Payer: No Typology Code available for payment source | Source: Ambulatory Visit | Attending: Obstetrics and Gynecology | Admitting: Obstetrics and Gynecology

## 2019-07-06 ENCOUNTER — Ambulatory Visit (HOSPITAL_COMMUNITY)
Admission: RE | Admit: 2019-07-06 | Discharge: 2019-07-06 | Disposition: A | Discharge status: Home / Self Care | Payer: Self-pay | Source: Ambulatory Visit | Attending: Obstetrics and Gynecology | Admitting: Obstetrics and Gynecology

## 2019-07-06 ENCOUNTER — Other Ambulatory Visit: Payer: Self-pay

## 2019-07-06 ENCOUNTER — Encounter (HOSPITAL_COMMUNITY): Payer: Self-pay

## 2019-07-06 DIAGNOSIS — N6320 Unspecified lump in the left breast, unspecified quadrant: Secondary | ICD-10-CM

## 2019-07-06 DIAGNOSIS — Z1239 Encounter for other screening for malignant neoplasm of breast: Secondary | ICD-10-CM | POA: Insufficient documentation

## 2019-07-06 DIAGNOSIS — N6325 Unspecified lump in the left breast, overlapping quadrants: Secondary | ICD-10-CM | POA: Insufficient documentation

## 2019-07-06 DIAGNOSIS — N611 Abscess of the breast and nipple: Secondary | ICD-10-CM

## 2019-07-06 NOTE — Progress Notes (Signed)
Complaints of left breast lump x one week that is red, warm, and painful to touch. Patient stated the redness has improved since she was given antibiotics 07/04/2019. Patient rates the pain at a 5 out of 10.  Pap Smear: Pap smear not completed today. Last Pap smear was 02/17/2017 at the [facility] and normal. Per patient has no history of an abnormal Pap smear. Last Pap smear result will be scanned into Epic.  Physical exam: Breasts Breasts symmetrical. No skin abnormalities right breast. Reddened area observed left breast at 9 o'clock next to areola. No nipple retraction right breast. Left nipple slightly inverted that per patient is normal for her. No nipple discharge bilateral breasts. No lymphadenopathy. No lumps palpated bilateral right breast. Palpated a lump within the left breast at 9 o'clock next to nipple. No complaints of pain or tenderness on exam. Referred patient to the [facility] for a left breast ultrasound. Appointment scheduled for Thursday, July 06, 2019 at 1010.        Pelvic/Bimanual No Pap smear completed today since last Pap smear was 02/17/2017. Pap smear not indicated per BCCCP guidelines.   Smoking History: Patient has never smoked.  Patient Navigation: Patient education provided. Access to services provided for patient through [facility] program. Spanish interpreter provided.   Breast and Cervical Cancer Risk Assessment: Patient has a family history of her mother having breast cancer. Patient has no known genetic mutations or history of radiation treatment to the chest before age 82. Patient has no history of cervical dysplasia, immunocompromised, or DES exposure in-utero. Breast cancer risk assessment completed. No breast cancer risk calculated due to patient is less than 25 years old.  Used Spanish interpreter [employer] from [place].

## 2019-07-06 NOTE — Patient Instructions (Signed)
Explained breast self awareness with Briana Shelton. Patient did not need a Pap smear today due to last Pap smear was 02/17/2017. Let her know BCCCP will cover Pap smears every 3 years unless has a history of abnormal Pap smears. Referred patient to the [facility] for a left breast ultrasound. Appointment scheduled for Thursday, July 06, 2019 at 1010. Patient aware of appointment and will be there. Briana Shelton verbalized understanding.  Askia Hazelip, Arvil Chaco, RN 8:18 AM

## 2019-07-17 ENCOUNTER — Ambulatory Visit
Admission: RE | Admit: 2019-07-17 | Discharge: 2019-07-17 | Disposition: A | Discharge status: Home / Self Care | Payer: No Typology Code available for payment source | Source: Ambulatory Visit | Attending: Obstetrics and Gynecology | Admitting: Obstetrics and Gynecology

## 2019-07-17 ENCOUNTER — Other Ambulatory Visit: Payer: Self-pay

## 2019-07-17 ENCOUNTER — Other Ambulatory Visit (HOSPITAL_COMMUNITY): Payer: Self-pay | Admitting: Obstetrics and Gynecology

## 2019-07-17 DIAGNOSIS — N611 Abscess of the breast and nipple: Secondary | ICD-10-CM

## 2019-08-08 ENCOUNTER — Other Ambulatory Visit: Payer: Self-pay

## 2019-08-08 ENCOUNTER — Ambulatory Visit
Admission: RE | Admit: 2019-08-08 | Discharge: 2019-08-08 | Disposition: A | Discharge status: Home / Self Care | Payer: No Typology Code available for payment source | Source: Ambulatory Visit | Attending: Obstetrics and Gynecology | Admitting: Obstetrics and Gynecology

## 2019-08-08 DIAGNOSIS — N611 Abscess of the breast and nipple: Secondary | ICD-10-CM

## 2019-10-04 ENCOUNTER — Encounter: Payer: Self-pay | Admitting: *Deleted

## 2019-11-17 ENCOUNTER — Ambulatory Visit: Payer: Self-pay | Admitting: Surgery

## 2019-11-17 NOTE — H&P (Signed)
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 She was last seen in February 2021 after being seen for a left breast abscess. This Better concern. She's had some intermittent drainage from what appears to be a small fistulous site of the left breast. A translator is used today for our visit. She has no significant pain but the area keeps straining. There is no fever or chills redness or warmth.  The patient is a 29 year old female.   Medication History (Armen Ferguson, CMA; 11/17/2019 10:49 AM) No Current Medications Medications Reconciled    Vitals (Armen Ferguson CMA; 11/17/2019 10:49 AM) 11/17/2019 10:48 AM Weight: 188.5 lb Height: 62in Body Surface Area: 1.86 m Body Mass Index: 34.48 kg/m  Temp.: 97.40F  Pulse: 84 (Regular)  P.OX: 99% (Room air) BP: 124/88(Sitting, Left Arm, Standard)        Physical Exam (Fawaz Borquez A. Lorenda Grecco MD; 11/17/2019 11:41 AM)  General Mental Status-Alert. General Appearance-Consistent with stated age. Hydration-Well hydrated. Voice-Normal.  Chest and Lung Exam Chest and lung exam reveals -quiet, even and easy respiratory effort with no use of accessory muscles and on auscultation, normal breath sounds, no adventitious sounds and normal vocal resonance. Inspection Chest Wall - Normal. Back - normal.  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 There is no abscess or redness but this is a small amount of yellowish drainage and appears chronic. No evidence of recent breast-feeding  Cardiovascular Cardiovascular examination reveals -normal heart sounds, regular rate and rhythm with no murmurs and normal pedal pulses  bilaterally.  Musculoskeletal Normal Exam - Left-Upper Extremity Strength Normal and Lower Extremity Strength Normal. Normal Exam - Right-Upper Extremity Strength Normal and Lower Extremity Strength Normal.    Assessment & Plan (Von Quintanar A. 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 This could be secondary to history of breast-feeding or independent of that. Discussed the risks and benefits of surgery with the help of a translator. Discussed nonoperative management options. Patient has opted for a left breast lumpectomy to hopefully get this area to heal up and prevent any future recurrences.  Current Plans You are being scheduled for surgery- Our schedulers will call you.  You should hear from our office's scheduling department within 5 working days about the location, date, and time of surgery. We try to make accommodations for patient's preferences in scheduling surgery, but sometimes the OR schedule or the surgeon's schedule prevents [place] from making those accommodations.  If you have not heard from our office [phone]) in 5 working days, call the office and ask for your surgeon's nurse.  If you have other questions about your diagnosis, plan, or surgery, call the office and ask for your surgeon's nurse.  The anatomy and the physiology was discussed. The pathophysiology and natural history of the disease was discussed. Options were discussed and recommendations were made. Technique, risks, benefits, & alternatives were discussed. Risks such as stroke, heart attack, bleeding, indection, death, and other risks discussed. Questions answered. The patient agrees to proceed. Pt Education - [facility] Free Text Education/Instructions: discussed with patient and provided information.

## 2019-12-13 ENCOUNTER — Other Ambulatory Visit: Payer: Self-pay

## 2019-12-13 ENCOUNTER — Encounter (HOSPITAL_BASED_OUTPATIENT_CLINIC_OR_DEPARTMENT_OTHER): Payer: Self-pay | Admitting: Surgery

## 2019-12-16 ENCOUNTER — Other Ambulatory Visit (HOSPITAL_COMMUNITY)
Admission: RE | Admit: 2019-12-16 | Discharge: 2019-12-16 | Disposition: A | Discharge status: Home / Self Care | Payer: HRSA Program | Source: Ambulatory Visit | Attending: Surgery | Admitting: Surgery

## 2019-12-16 DIAGNOSIS — Z01812 Encounter for preprocedural laboratory examination: Secondary | ICD-10-CM | POA: Insufficient documentation

## 2019-12-16 DIAGNOSIS — Z20822 Contact with and (suspected) exposure to covid-19: Secondary | ICD-10-CM | POA: Insufficient documentation

## 2019-12-17 LAB — SARS CORONAVIRUS 2 (TAT 6-24 HRS): SARS Coronavirus 2: NEGATIVE

## 2019-12-19 ENCOUNTER — Encounter (HOSPITAL_BASED_OUTPATIENT_CLINIC_OR_DEPARTMENT_OTHER)
Admission: RE | Admit: 2019-12-19 | Discharge: 2019-12-19 | Disposition: A | Discharge status: Home / Self Care | Payer: No Typology Code available for payment source | Source: Ambulatory Visit | Attending: Surgery | Admitting: Surgery

## 2019-12-19 DIAGNOSIS — Z01812 Encounter for preprocedural laboratory examination: Secondary | ICD-10-CM | POA: Insufficient documentation

## 2019-12-19 LAB — CBC WITH DIFFERENTIAL/PLATELET
Abs Immature Granulocytes: 0.01 10*3/uL (ref 0.00–0.07)
Basophils Absolute: 0.1 10*3/uL (ref 0.0–0.1)
Basophils Relative: 1 %
Eosinophils Absolute: 0.1 10*3/uL (ref 0.0–0.5)
Eosinophils Relative: 2 %
HCT: 40.1 % (ref 36.0–46.0)
Hemoglobin: 13.5 g/dL (ref 12.0–15.0)
Immature Granulocytes: 0 %
Lymphocytes Relative: 33 %
Lymphs Abs: 2 10*3/uL (ref 0.7–4.0)
MCH: 30.1 pg (ref 26.0–34.0)
MCHC: 33.7 g/dL (ref 30.0–36.0)
MCV: 89.3 fL (ref 80.0–100.0)
Monocytes Absolute: 0.3 10*3/uL (ref 0.1–1.0)
Monocytes Relative: 5 %
Neutro Abs: 3.5 10*3/uL (ref 1.7–7.7)
Neutrophils Relative %: 59 %
Platelets: 292 10*3/uL (ref 150–400)
RBC: 4.49 MIL/uL (ref 3.87–5.11)
RDW: 13.2 % (ref 11.5–15.5)
WBC: 5.9 10*3/uL (ref 4.0–10.5)
nRBC: 0 % (ref 0.0–0.2)

## 2019-12-19 LAB — COMPREHENSIVE METABOLIC PANEL
ALT: 30 U/L (ref 0–44)
AST: 17 U/L (ref 15–41)
Albumin: 3.6 g/dL (ref 3.5–5.0)
Alkaline Phosphatase: 71 U/L (ref 38–126)
Anion gap: 8 (ref 5–15)
BUN: 13 mg/dL (ref 6–20)
CO2: 22 mmol/L (ref 22–32)
Calcium: 8.6 mg/dL — ABNORMAL LOW (ref 8.9–10.3)
Chloride: 107 mmol/L (ref 98–111)
Creatinine, Ser: 0.47 mg/dL (ref 0.44–1.00)
GFR calc Af Amer: >60 mL/min (ref >60)
GFR calc non Af Amer: >60 mL/min (ref >60)
Glucose, Bld: 95 mg/dL (ref 70–99)
Potassium: 4.7 mmol/L (ref 3.5–5.1)
Sodium: 137 mmol/L (ref 135–145)
Total Bilirubin: 0.7 mg/dL (ref 0.3–1.2)
Total Protein: 6.5 g/dL (ref 6.5–8.1)

## 2019-12-19 LAB — POCT PREGNANCY, URINE: Preg Test, Ur: NEGATIVE

## 2019-12-19 MED ORDER — CHLORHEXIDINE GLUCONATE CLOTH 2 % EX PADS: 6.0000 | MEDICATED_PAD | Freq: Once | CUTANEOUS | Status: DC

## 2019-12-19 NOTE — Progress Notes (Signed)

## 2019-12-20 ENCOUNTER — Ambulatory Visit (HOSPITAL_BASED_OUTPATIENT_CLINIC_OR_DEPARTMENT_OTHER)
Admission: RE | Admit: 2019-12-20 | Discharge: 2019-12-20 | Disposition: A | Discharge status: Home / Self Care | Payer: No Typology Code available for payment source | Attending: Surgery | Admitting: Surgery

## 2019-12-20 ENCOUNTER — Ambulatory Visit (HOSPITAL_BASED_OUTPATIENT_CLINIC_OR_DEPARTMENT_OTHER): Payer: No Typology Code available for payment source | Admitting: Certified Registered"

## 2019-12-20 ENCOUNTER — Encounter (HOSPITAL_BASED_OUTPATIENT_CLINIC_OR_DEPARTMENT_OTHER): Payer: Self-pay | Admitting: Surgery

## 2019-12-20 ENCOUNTER — Other Ambulatory Visit: Payer: Self-pay

## 2019-12-20 ENCOUNTER — Encounter (HOSPITAL_BASED_OUTPATIENT_CLINIC_OR_DEPARTMENT_OTHER)
Admission: RE | Disposition: A | Discharge status: Home / Self Care | Payer: Self-pay | Source: Home / Self Care | Attending: Surgery

## 2019-12-20 DIAGNOSIS — N641 Fat necrosis of breast: Secondary | ICD-10-CM | POA: Insufficient documentation

## 2019-12-20 DIAGNOSIS — N611 Abscess of the breast and nipple: Secondary | ICD-10-CM | POA: Insufficient documentation

## 2019-12-20 DIAGNOSIS — I10 Essential (primary) hypertension: Secondary | ICD-10-CM | POA: Insufficient documentation

## 2019-12-20 HISTORY — PX: BREAST LUMPECTOMY: SHX2

## 2019-12-20 LAB — POCT PREGNANCY, URINE: Preg Test, Ur: NEGATIVE

## 2019-12-20 SURGERY — BREAST LUMPECTOMY
Anesthesia: General | Site: Breast | Laterality: Left

## 2019-12-20 MED ORDER — DEXAMETHASONE SODIUM PHOSPHATE 10 MG/ML IJ SOLN
INTRAMUSCULAR | Status: DC | PRN
  Administered 2019-12-20: 5 mg via INTRAVENOUS

## 2019-12-20 MED ORDER — CEFAZOLIN SODIUM-DEXTROSE 2-4 GM/100ML-% IV SOLN
INTRAVENOUS | Status: AC
  Filled 2019-12-20: qty 100

## 2019-12-20 MED ORDER — ACETAMINOPHEN 500 MG PO TABS
ORAL_TABLET | ORAL | Status: AC
  Filled 2019-12-20: qty 2

## 2019-12-20 MED ORDER — PROPOFOL 10 MG/ML IV BOLUS
INTRAVENOUS | Status: DC | PRN
  Administered 2019-12-20: 200 mg via INTRAVENOUS

## 2019-12-20 MED ORDER — ROCURONIUM BROMIDE 100 MG/10ML IV SOLN
INTRAVENOUS | Status: DC | PRN
  Administered 2019-12-20: 30 mg via INTRAVENOUS

## 2019-12-20 MED ORDER — MIDAZOLAM HCL 5 MG/5ML IJ SOLN
INTRAMUSCULAR | Status: DC | PRN
  Administered 2019-12-20: 2 mg via INTRAVENOUS

## 2019-12-20 MED ORDER — OXYCODONE HCL 5 MG PO TABS
5.0000 mg | ORAL_TABLET | Freq: Four times a day (QID) | ORAL | 0 refills | Status: AC | PRN
Start: 2019-12-20 — End: ?

## 2019-12-20 MED ORDER — ACETAMINOPHEN 500 MG PO TABS
1000.0000 mg | ORAL_TABLET | ORAL | Status: AC
  Administered 2019-12-20: 1000 mg via ORAL

## 2019-12-20 MED ORDER — LACTATED RINGERS IV SOLN: INTRAVENOUS | Status: DC

## 2019-12-20 MED ORDER — IBUPROFEN 800 MG PO TABS
800.0000 mg | ORAL_TABLET | Freq: Three times a day (TID) | ORAL | 0 refills | Status: AC | PRN
Start: 2019-12-20 — End: ?

## 2019-12-20 MED ORDER — ONDANSETRON HCL 4 MG/2ML IJ SOLN
INTRAMUSCULAR | Status: DC | PRN
  Administered 2019-12-20: 4 mg via INTRAVENOUS

## 2019-12-20 MED ORDER — HYDROMORPHONE HCL 1 MG/ML IJ SOLN: 0.2500 mg | INTRAMUSCULAR | Status: DC | PRN

## 2019-12-20 MED ORDER — OXYCODONE HCL 5 MG/5ML PO SOLN: 5.0000 mg | Freq: Once | ORAL | Status: DC | PRN

## 2019-12-20 MED ORDER — SUGAMMADEX SODIUM 200 MG/2ML IV SOLN
INTRAVENOUS | Status: DC | PRN
  Administered 2019-12-20: 175 mg via INTRAVENOUS

## 2019-12-20 MED ORDER — FENTANYL CITRATE (PF) 100 MCG/2ML IJ SOLN
INTRAMUSCULAR | Status: AC
  Filled 2019-12-20: qty 2

## 2019-12-20 MED ORDER — BUPIVACAINE HCL (PF) 0.25 % IJ SOLN
INTRAMUSCULAR | Status: DC | PRN
  Administered 2019-12-20: 20 mL

## 2019-12-20 MED ORDER — MIDAZOLAM HCL 2 MG/2ML IJ SOLN
INTRAMUSCULAR | Status: AC
  Filled 2019-12-20: qty 2

## 2019-12-20 MED ORDER — PHENYLEPHRINE 40 MCG/ML (10ML) SYRINGE FOR IV PUSH (FOR BLOOD PRESSURE SUPPORT)
PREFILLED_SYRINGE | INTRAVENOUS | Status: DC | PRN
  Administered 2019-12-20 (×2): 80 ug via INTRAVENOUS

## 2019-12-20 MED ORDER — SUCCINYLCHOLINE CHLORIDE 200 MG/10ML IV SOSY
PREFILLED_SYRINGE | INTRAVENOUS | Status: DC | PRN
Start: 2019-12-20 — End: 2019-12-20
  Administered 2019-12-20: 140 mg via INTRAVENOUS

## 2019-12-20 MED ORDER — PROMETHAZINE HCL 25 MG/ML IJ SOLN: 6.2500 mg | INTRAMUSCULAR | Status: DC | PRN

## 2019-12-20 MED ORDER — OXYCODONE HCL 5 MG PO TABS: 5.0000 mg | ORAL_TABLET | Freq: Once | ORAL | Status: DC | PRN

## 2019-12-20 MED ORDER — LIDOCAINE 2% (20 MG/ML) 5 ML SYRINGE
INTRAMUSCULAR | Status: DC | PRN
  Administered 2019-12-20: 40 mg via INTRAVENOUS

## 2019-12-20 MED ORDER — FENTANYL CITRATE (PF) 100 MCG/2ML IJ SOLN
INTRAMUSCULAR | Status: DC | PRN
  Administered 2019-12-20: 25 ug via INTRAVENOUS
  Administered 2019-12-20: 50 ug via INTRAVENOUS
  Administered 2019-12-20: 25 ug via INTRAVENOUS

## 2019-12-20 MED ORDER — MEPERIDINE HCL 25 MG/ML IJ SOLN: 6.2500 mg | INTRAMUSCULAR | Status: DC | PRN

## 2019-12-20 MED ORDER — CEFAZOLIN SODIUM-DEXTROSE 2-4 GM/100ML-% IV SOLN
2.0000 g | INTRAVENOUS | Status: AC
  Administered 2019-12-20: 2 g via INTRAVENOUS

## 2019-12-20 SURGICAL SUPPLY — 34 items
BINDER BREAST XXLRG (GAUZE/BANDAGES/DRESSINGS) ×2 IMPLANT
BLADE SURG 15 STRL LF DISP TIS (BLADE) ×1 IMPLANT
BLADE SURG 15 STRL SS (BLADE) ×2
CANISTER SUCT 1200ML W/VALVE (MISCELLANEOUS) ×3 IMPLANT
CHLORAPREP W/TINT 26 (MISCELLANEOUS) ×3 IMPLANT
COVER BACK TABLE 60X90IN (DRAPES) ×3 IMPLANT
COVER MAYO STAND STRL (DRAPES) ×3 IMPLANT
DECANTER SPIKE VIAL GLASS SM (MISCELLANEOUS) ×3 IMPLANT
DERMABOND ADVANCED (GAUZE/BANDAGES/DRESSINGS) ×2
DERMABOND ADVANCED .7 DNX12 (GAUZE/BANDAGES/DRESSINGS) ×1 IMPLANT
DRAPE LAPAROTOMY 100X72 PEDS (DRAPES) ×3 IMPLANT
DRAPE UTILITY XL STRL (DRAPES) ×3 IMPLANT
ELECT COATED BLADE 2.86 ST (ELECTRODE) ×3 IMPLANT
ELECT REM PT RETURN 9FT ADLT (ELECTROSURGICAL) ×3
ELECTRODE REM PT RTRN 9FT ADLT (ELECTROSURGICAL) ×1 IMPLANT
GLOVE BIOGEL PI IND STRL 8 (GLOVE) ×1 IMPLANT
GLOVE BIOGEL PI INDICATOR 8 (GLOVE) ×2
GLOVE ECLIPSE 8.0 STRL XLNG CF (GLOVE) ×3 IMPLANT
GOWN STRL REUS W/ TWL LRG LVL3 (GOWN DISPOSABLE) ×2 IMPLANT
GOWN STRL REUS W/TWL LRG LVL3 (GOWN DISPOSABLE) ×4
NDL HYPO 25X1 1.5 SAFETY (NEEDLE) ×1 IMPLANT
NEEDLE HYPO 25X1 1.5 SAFETY (NEEDLE) ×3 IMPLANT
NS IRRIG 1000ML POUR BTL (IV SOLUTION) ×3 IMPLANT
PENCIL SMOKE EVACUATOR (MISCELLANEOUS) ×3 IMPLANT
SET BASIN DAY SURGERY F.S. (CUSTOM PROCEDURE TRAY) ×3 IMPLANT
SLEEVE SCD COMPRESS KNEE MED (MISCELLANEOUS) ×3 IMPLANT
SPONGE LAP 4X18 RFD (DISPOSABLE) ×3 IMPLANT
SUT MON AB 4-0 PC3 18 (SUTURE) ×3 IMPLANT
SUT VICRYL 3-0 CR8 SH (SUTURE) ×3 IMPLANT
SYR CONTROL 10ML LL (SYRINGE) ×3 IMPLANT
TOWEL GREEN STERILE FF (TOWEL DISPOSABLE) ×6 IMPLANT
TUBE CONNECTING 20'X1/4 (TUBING) ×1
TUBE CONNECTING 20X1/4 (TUBING) ×2 IMPLANT
YANKAUER SUCT BULB TIP NO VENT (SUCTIONS) ×3 IMPLANT

## 2019-12-20 NOTE — Anesthesia Procedure Notes (Signed)
Procedure Name: Intubation Date/Time: 12/20/2019 10:44 AM Performed by: Elliot Dally, CRNA Pre-anesthesia Checklist: Patient identified, Emergency Drugs available, Suction available and Patient being monitored Patient Re-evaluated:Patient Re-evaluated prior to induction Oxygen Delivery Method: Circle System Utilized Preoxygenation: Pre-oxygenation with 100% oxygen Induction Type: IV induction Ventilation: Mask ventilation without difficulty Laryngoscope Size: Miller and 2 Grade View: Grade I Tube type: Oral Tube size: 7.0 mm Number of attempts: 1 Airway Equipment and Method: Stylet and Oral airway Placement Confirmation: ETT inserted through vocal cords under direct vision,  positive ETCO2 and breath sounds checked- equal and bilateral Secured at: 21 cm Tube secured with: Tape Dental Injury: Teeth and Oropharynx as per pre-operative assessment

## 2019-12-20 NOTE — Anesthesia Preprocedure Evaluation (Signed)
Anesthesia Evaluation  Patient identified by MRN, date of birth, ID band Patient awake    Reviewed: Allergy & Precautions, NPO status , Patient's Chart, lab work & pertinent test results  Airway Mallampati: II  TM Distance: >3 FB Neck ROM: Full    Dental no notable dental hx.    Pulmonary neg pulmonary ROS,    Pulmonary exam normal breath sounds clear to auscultation       Cardiovascular hypertension, Pt. on medications negative cardio ROS Normal cardiovascular exam Rhythm:Regular Rate:Normal     Neuro/Psych negative neurological ROS  negative psych ROS   GI/Hepatic negative GI ROS, Neg liver ROS,   Endo/Other  negative endocrine ROS  Renal/GU negative Renal ROS  negative genitourinary   Musculoskeletal negative musculoskeletal ROS (+)   Abdominal (+) + obese,   Peds negative pediatric ROS (+)  Hematology negative hematology ROS (+)   Anesthesia Other Findings   Reproductive/Obstetrics negative OB ROS                             Anesthesia Physical Anesthesia Plan  ASA: II  Anesthesia Plan: General   Post-op Pain Management:    Induction: Intravenous  PONV Risk Score and Plan: 3 and Ondansetron, Dexamethasone, Midazolam and Treatment may vary due to age or medical condition  Airway Management Planned: LMA  Additional Equipment:   Intra-op Plan:   Post-operative Plan: Extubation in OR  Informed Consent: I have reviewed the patients History and Physical, chart, labs and discussed the procedure including the risks, benefits and alternatives for the proposed anesthesia with the patient or authorized representative who has indicated his/her understanding and acceptance.     Dental advisory given  Plan Discussed with: CRNA  Anesthesia Plan Comments:         Anesthesia Quick Evaluation  

## 2019-12-20 NOTE — Transfer of Care (Signed)
Immediate Anesthesia Transfer of Care Note  Patient: Tora Prunty  Procedure(s) Performed: LEFT BREAST LUMPECTOMY (Left Breast)  Patient Location: PACU  Anesthesia Type:General  Level of Consciousness: drowsy and patient cooperative  Airway & Oxygen Therapy: Patient Spontanous Breathing and Patient connected to face mask oxygen  Post-op Assessment: Report given to RN and Post -op Vital signs reviewed and stable  Post vital signs: Reviewed and stable  Last Vitals:  Vitals Value Taken Time  BP 116/69 12/20/19 1130  Temp    Pulse 78 12/20/19 1131  Resp 21 12/20/19 1131  SpO2 97 % 12/20/19 1131  Vitals shown include unvalidated device data.  Last Pain:  Vitals:   12/20/19 0951  TempSrc: Oral  PainSc: 0-No pain         Complications: No apparent anesthesia complications

## 2019-12-20 NOTE — Anesthesia Procedure Notes (Signed)
Procedure Name: LMA Insertion Date/Time: 12/20/2019 10:32 AM Performed by: Elliot Dally, CRNA Pre-anesthesia Checklist: Patient identified, Emergency Drugs available, Suction available and Patient being monitored Patient Re-evaluated:Patient Re-evaluated prior to induction Oxygen Delivery Method: Circle System Utilized Preoxygenation: Pre-oxygenation with 100% oxygen Induction Type: IV induction Ventilation: Mask ventilation without difficulty LMA: LMA inserted LMA Size: 4.0 Number of attempts: 1 Airway Equipment and Method: Bite block Placement Confirmation: positive ETCO2 Tube secured with: Tape Dental Injury: Teeth and Oropharynx as per pre-operative assessment

## 2019-12-20 NOTE — Anesthesia Postprocedure Evaluation (Signed)
Anesthesia Post Note  Patient: Briana Shelton  Procedure(s) Performed: LEFT BREAST LUMPECTOMY (Left Breast)     Patient location during evaluation: PACU Anesthesia Type: General Level of consciousness: awake and alert Pain management: pain level controlled Vital Signs Assessment: post-procedure vital signs reviewed and stable Respiratory status: spontaneous breathing, nonlabored ventilation and respiratory function stable Cardiovascular status: blood pressure returned to baseline and stable Postop Assessment: no apparent nausea or vomiting Anesthetic complications: no    Last Vitals:  Vitals:   12/20/19 1215 12/20/19 1255  BP:  132/64  Pulse: 77 87  Resp: 16 18  Temp:  36.9 C  SpO2: 100% 98%    Last Pain:  Vitals:   12/20/19 1255  TempSrc: Oral  PainSc: 2                  Lowella Curb

## 2019-12-20 NOTE — H&P (Signed)
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 She was last seen in February 2021 after being seen for a left breast abscess. This Better concern. She's had some intermittent drainage from what appears to be a small fistulous site of the left breast. A translator is used today for our visit. She has no significant pain but the area keeps straining. There is no fever or chills redness or warmth.  The patient is a 29 year old female.   Medication History  No Current Medications Medications Reconciled    Vitals  11/17/2019 10:48 AM Weight: 188.5 lb Height: 62in Body Surface Area: 1.86 m Body Mass Index: 34.48 kg/m  Temp.: 97.1F  Pulse: 84 (Regular)  P.OX: 99% (Room air) BP: 124/88(Sitting, Left Arm, Standard)        Physical Exam  General Mental Status-Alert. General Appearance-Consistent with stated age. Hydration-Well hydrated. Voice-Normal.  Chest and Lung Exam Chest and lung exam reveals -quiet, even and easy respiratory effort with no use of accessory muscles and on auscultation, normal breath sounds, no adventitious sounds and normal vocal resonance. Inspection Chest Wall - Normal. Back - normal.  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 There is no abscess or redness but this is a small amount of yellowish drainage and appears chronic. No evidence of recent breast-feeding  Cardiovascular Cardiovascular examination reveals -normal heart sounds, regular rate and rhythm with no murmurs and normal pedal pulses bilaterally.  Musculoskeletal Normal Exam - Left-Upper Extremity Strength Normal and Lower Extremity Strength Normal. Normal Exam - Right-Upper Extremity Strength Normal and  Lower Extremity Strength Normal.    Assessment & Plan ( LEFT BREAST ABSCESS (N61.1) Impression: now has chronic abscess and fistula recommend excision in the OR Due to the chronicity recommended lumpectomy to excise the tract and residual abscess cavity. This could be secondary to history of breast-feeding or independent of that. Discussed the risks and benefits of surgery with the help of a translator. Discussed nonoperative management options. Patient has opted for a left breast lumpectomy to hopefully get this area to heal up and prevent any future recurrences.  Current Plans You are being scheduled for surgery- Our schedulers will call you.  You should hear from our office's scheduling department within 5 working days about the location, date, and time of surgery. We try to make accommodations for patient's preferences in scheduling surgery, but sometimes the OR schedule or the surgeon's schedule prevents [place] from making those accommodations.  If you have not heard from our office [phone]) in 5 working days, call the office and ask for your surgeon's nurse.  If you have other questions about your diagnosis, plan, or surgery, call the office and ask for your surgeon's nurse.  The anatomy and the physiology was discussed. The pathophysiology and natural history of the disease was discussed. Options were discussed and recommendations were made. Technique, risks, benefits, & alternatives were discussed. Risks such as stroke, heart attack, bleeding, indection, death, and other risks discussed. Questions answered. The patient agrees to proceed. Pt Education - CCS Free Text Education/Instructions: discussed with patient and provided information.

## 2019-12-20 NOTE — Op Note (Signed)
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  She had recurrent subareolar left breast abscesses off and on.  These been managed medically but they keep recurring despite maximal medical management.  She presents today for excision of a chronic sinus tract with hope to help resolve her symptoms.The procedure has been discussed with the patient. Alternatives to surgery have been discussed with the patient.  Risks of surgery include bleeding,  Infection,  Seroma formation, death,  and the need for further surgery.   The patient understands and wishes to proceed.   Description of procedure: The patient was met in the holding area.  Translator was available to answer and ask questions.  She had no further questions about the procedure.  She was then taken back to the operative room.  She is placed supine upon the OR table.  After induction general anesthesia left breast was prepped and draped in sterile fashion and timeout performed.  Proper patient, site and procedure were verified.  A lacrimal probe was used and the sinus tract was located at 9:00 and extended toward the medial midline.  Sinus tract was cannulated with the probe and this went toward the nipple from medial to lateral and ended at deep and lateral to the nipple.  I made a incision along the medial border of the nipple areolar complex and extended toward the sinus tract opening.  This was opened up with cautery.  I then grabbed the sinus tract and used cautery to excise in sinus tract wound which went from medial to lateral and ended up deep and lateral to the  nipple areolar complex.  This was excised in its entirety.  Cavity was irrigated.  Hemostasis achieved.  The deep layers were closed with 3-0 Vicryl.  I then approximated the subcutaneous tissues with 3-0 Vicryl as well.  4 Monocryl was used to close the skin of the incision.  Dermabond applied.  All counts were found to be correct.  The patient was awoke extubated taken recovery in satisfactory condition. 

## 2019-12-20 NOTE — Discharge Instructions (Signed)
Lumpectomy  Tumorectoma, cuidados posteriores Lumpectomy, Care After Esta hoja le brinda informacin sobre cmo cuidarse despus del procedimiento. Su mdico tambin podr darle instrucciones ms especficas. Comunquese con el mdico si tiene problemas o preguntas. Qu puedo esperar despus del procedimiento? Despus del procedimiento, es comn [employer] siguientes sntomas:  Hinchazn de las [place].  Dolor a Multimedia programmer.  Rigidez en el brazo o el hombro.  Cambio en la forma y la sensibilidad de las [place].  Tejido cicatricial que se siente duro al tacto en la zona donde se extrajo el ndulo. Siga estas instrucciones en su casa: Medicamentos  [employer] de venta libre y los recetados solamente como se lo haya indicado el mdico.  Si le recetaron un antibitico, tmelo como se lo haya indicado el mdico. No deje de tomar los antibiticos aunque comience a [place].  Pregntele al mdico si el medicamento recetado: ? Hace necesario que evite conducir o usar maquinaria pesada. ? Puede causarle estreimiento. Es posible que tenga que tomar estas medidas para prevenir o tratar el estreimiento:  Beba suficiente lquido como para Pharmacologist la orina de color amarillo plido.  Tome medicamentos recetados o de venta [place].  Consuma alimentos ricos en fibra, como frijoles, cereales integrales, y frutas y verduras frescas.  Limite el consumo de alimentos ricos en grasa y azcares procesados, como alimentos fritos o dulces. Cuidados de la incisin      Siga las instrucciones del mdico acerca del cuidado de la incisin. Asegrese de hacer lo siguiente: ? Lvese las manos con agua y [place] antes y despus de cambiar la venda (vendaje). Use desinfectante para manos si no dispone de [place] y [place]. ? Cambie el vendaje como se lo haya indicado el mdico. ? No retire los puntos (suturas), la goma para cerrar la piel o las tiras [place]. Es posible que estos  cierres cutneos deban quedar puestos en la piel durante 2semanas o ms tiempo. Si los bordes de las tiras [address] empiezan a despegarse y Scientific laboratory technician, puede recortar los que estn sueltos. No retire las tiras [employer] por completo a menos que el mdico se lo indique.  Controle la zona de la incisin todos los [address]  [address] para detectar signos de infeccin. Est atenta a los siguientes signos: ? Aumento del enrojecimiento, la hinchazn o Chief Technology Officer. ? Lquido o sangre. ? Calor. ? Pus o mal olor.  Mantenga el vendaje limpio y seco.  Si la enviaron de regreso a su casa con un drenaje quirrgico colocado, siga las indicaciones del mdico sobre cmo vaciarlo. Baos  No tome baos de inmersin, no nade ni use el jacuzzi hasta que el mdico la autorice.  Pregntele al mdico si puede ducharse. Delle Reining solo le permitan darse baos de [place]. Actividad  Haga reposo como se lo haya indicado el mdico.  Evite estar sentada durante largos perodos sin moverse. Levntese y camine un poco cada 1 a 2 horas. Esto es importante para mejorar el flujo sanguneo y la respiracin. Pida ayuda si se siente dbil o inestable.  Retome sus actividades normales segn lo indicado por el mdico. Pregntele al mdico qu actividades son seguras para usted.  Evite cualquier actividad que pueda causarle una lesin en el brazo que est del lado de la [place].  No levante ningn objeto que pese ms de 10libras (4,5kg) o el lmite de peso que le hayan indicado, hasta que el mdico le diga que puede [place]. Evite levantar objetos con el brazo que est del  lado de la [place].  No cargue objetos pesados sobre el hombro del lado de la [place].  Haga ejercicios para evitar que el hombro y el brazo se pongan rgidos y se hinchen. Consulte al [employer] tipos de ejercicios que son seguros para usted. Instrucciones generales  Use un sostn de soporte como se lo haya indicado su mdico.  Cuando est sentada o acostada,  levante (eleve) el brazo por encima del nivel del corazn.  No use anillos, pulseras ni otros accesorios ajustados en el brazo, la [place] o los dedos del lado de la [place].  Concurra a todas las visitas de [address] se lo haya indicado el mdico. Esto es importante. ? Probablemente necesite que le hagan un estudio para determinar la presencia de ms lquido alrededor de los ganglios linfticos e hinchazn en la mama y el brazo (linfedema). Siga las instrucciones del mdico acerca de la frecuencia con la que se debe hacer los controles.  Si le han extrado algn ganglio linftico durante el procedimiento, asegrese de darles toda la informacin a sus mdicos. Esta es una informacin importante que se debe compartir antes de ciertos procedimientos, como anlisis de [place] o medicin de presin arterial. Comunquese con un mdico si:  Presenta una erupcin cutnea.  Tiene fiebre.  Los analgsicos no [employer].  Tiene hinchazn, debilidad o adormecimiento en el brazo que no mejora despus de unas semanas.  Tiene una hinchazn nueva en la mama.  Tiene cualquiera de estos signos de infeccin: ? Ms enrojecimiento, hinchazn o dolor en la zona de la incisin. ? Lquido o sangre que salen de la incisin. ? Calor que proviene de la zona de la incisin. ? Pus o mal olor en Immunologist de la incisin. Solicite ayuda inmediatamente si tiene:  Investment banker, operational intenso en la mama o el brazo.  Hinchazn en las piernas o los brazos.  Enrojecimiento, calor o dolor en las piernas o los brazos.  Dolor de pecho.  Dificultad para respirar. Resumen  Despus del procedimiento, es comn tener sensibilidad al tacto e hinchazn en la mama, y rigidez en el brazo y [place].  Siga las instrucciones del mdico acerca del cuidado de la incisin.  No levante ningn objeto que pese ms de 10libras (4,5kg) o el lmite de peso que le hayan indicado, hasta que el mdico le diga que puede [place].  Evite levantar objetos con el brazo que est del lado de la [place].  Si le han extrado algn ganglio linftico durante el procedimiento, asegrese de darles toda la informacin a sus mdicos. Esta es una informacin importante que se debe compartir antes de ciertos procedimientos, como anlisis de [place] o medicin de presin arterial. Esta informacin no tiene Theme park manager el consejo del mdico. Asegrese de hacerle al mdico cualquier pregunta que tenga. Document Revised: 02/22/2019 Document Reviewed: 02/22/2019 Elsevier Patient Education  2020 Elsevier Inc.   NO TYLENOL PRODUCTS UNTIL 4:00 PM    Post Anesthesia Home Care Instructions  Activity: Get plenty of rest for the remainder of the day. A responsible individual must stay with you for 24 hours following the procedure.  For the next 24 hours, DO NOT: -Drive a car -Advertising copywriter -Drink alcoholic beverages -Take any medication unless instructed by your physician -Make any legal decisions or sign important papers.  Meals: Start with liquid foods such as gelatin or soup. Progress to regular foods as tolerated. Avoid greasy, spicy, heavy foods. If nausea and/or vomiting occur, drink only clear  liquids until the nausea and/or vomiting subsides. Call your physician if vomiting continues.  Special Instructions/Symptoms: Your throat may feel dry or sore from the anesthesia or the breathing tube placed in your throat during surgery. If this causes discomfort, gargle with warm salt water. The discomfort should disappear within 24 hours.  If you had a scopolamine patch placed behind your ear for the management of post- operative nausea and/or vomiting:  1. The medication in the patch is effective for 72 hours, after which it should be removed.  Wrap patch in a tissue and discard in the trash. Wash hands thoroughly with soap and water. 2. You may remove the patch earlier than 72 hours if you experience unpleasant side effects  which may include dry mouth, dizziness or visual disturbances. 3. Avoid touching the patch. Wash your hands with soap and water after contact with the patch.

## 2019-12-21 ENCOUNTER — Encounter: Payer: Self-pay | Admitting: *Deleted

## 2019-12-21 LAB — SURGICAL PATHOLOGY

## 2020-04-03 ENCOUNTER — Ambulatory Visit: Payer: No Typology Code available for payment source | Admitting: Obstetrics and Gynecology

## 2020-05-13 ENCOUNTER — Ambulatory Visit (INDEPENDENT_AMBULATORY_CARE_PROVIDER_SITE_OTHER): Payer: Self-pay | Admitting: Obstetrics and Gynecology

## 2020-05-13 ENCOUNTER — Other Ambulatory Visit (HOSPITAL_COMMUNITY)
Admission: RE | Admit: 2020-05-13 | Discharge: 2020-05-13 | Disposition: A | Discharge status: Home / Self Care | Payer: No Typology Code available for payment source | Source: Ambulatory Visit | Attending: Obstetrics and Gynecology | Admitting: Obstetrics and Gynecology

## 2020-05-13 ENCOUNTER — Other Ambulatory Visit: Payer: Self-pay

## 2020-05-13 ENCOUNTER — Encounter: Payer: Self-pay | Admitting: Obstetrics and Gynecology

## 2020-05-13 DIAGNOSIS — N870 Mild cervical dysplasia: Secondary | ICD-10-CM

## 2020-05-13 DIAGNOSIS — Z3202 Encounter for pregnancy test, result negative: Secondary | ICD-10-CM

## 2020-05-13 LAB — POCT PREGNANCY, URINE: Preg Test, Ur: NEGATIVE

## 2020-05-13 NOTE — Addendum Note (Signed)
Addended by: Warden Fillers on: 05/13/2020 03:13 PM   Modules accepted: Orders

## 2020-05-13 NOTE — Progress Notes (Addendum)
Patient given informed consent, signed copy in the chart, time out was performed.  Placed in lithotomy position. Cervix viewed with speculum and colposcope after application of acetic acid.   Colposcopy adequate?  yes Acetowhite lesions? Yes , circumferential around transition zone Punctation? no Mosaicism?  no Abnormal vasculature?  no Biopsies? Taken at 2 and 7 oclock ECC? 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  She will return in 2 weeks for results.  Warden Fillers, MD

## 2020-05-13 NOTE — Patient Instructions (Signed)
Colposcopa, cuidados posteriores Colposcopy, Care After Lea esta informacin sobre cmo cuidarse despus del procedimiento. Su mdico tambin podr darle indicaciones ms especficas. Comunquese con su mdico si tiene problemas o preguntas. Qu puedo esperar despus del procedimiento? Si se le realiz una colposcopa sin biopsia, puede esperar sentirse bien de inmediato, pero es posible que presente manchas de sangre por [address] Puede reanudar sus actividades habituales. Si se le realiz una colposcopa con biopsia, es frecuente que presente lo siguiente:  Sensibilidad y Engineer, mining. Esto puede durar [employer].  Sensacin de desvanecimiento.  Sangrado leve de la vagina o secrecin granulada de color oscuro. Esto puede durar [employer]. La secrecin puede deberse a una solucin que se [place] [employer] procedimiento. Durante este tiempo deber usar un apsito sanitario.  [place] durante al menos 48horas despus del procedimiento. Siga estas indicaciones en su casa:   [employer] medicamentos de venta libre y los recetados solamente como se lo haya indicado el mdico. Hable con el medicamento acerca de qu tipo de analgsico de venta libre y recetado puede volver a Careers adviser. Es especialmente importante que hable con el mdico si toma medicamentos anticoagulantes.  No conduzca ni use maquinaria pesada mientras toma analgsicos recetados.  Durante al menos 3 das despus del procedimiento o durante el tiempo que le haya indicado el mdico, evite lo siguiente: ? Las [employer]eps[employer]Co. ? Los tampones. ? Tener [employer] [place] [employer].  Contine usando un mtodo anticonceptivo (anticoncepcin).  Limite la actividad fsica durante Film/video editor despus del procedimiento como se lo haya indicado el mdico. Pregntele al mdico qu actividades son seguras para usted.  Es su responsabilidad retirar [employer] del procedimiento. Consulte a su mdico o en el departamento donde se realice el  procedimiento cundo estarn [employer].  Concurra a todas las visitas de control como se lo haya indicado el mdico. Esto es importante. Comunquese con un mdico si:  Tiene una erupcin cutnea. Solicite ayuda de inmediato si:  Tiene una hemorragia vaginal abundante o elimina cogulos de [place]. Esto incluye usar ms de un apsito sanitario por hora durante 2 horas seguidas.  Tiene fiebre o siente escalofros.  Siente dolor plvico.  Tiene secrecin vaginal anormal, color amarillenta o con mal olor. Puede ser un signo de infeccin.  Tiene dolor intenso o clicos en la parte baja del abdomen que no se alivian con medicamentos.  Tiene vahdos, se siente mareada o se desmaya. Resumen  Si se le realiz una colposcopa sin biopsia, puede esperar sentirse bien de inmediato, pero es posible que presente manchas de sangre por [address]. Puede reanudar sus actividades habituales.  Si se le realiz una colposcopa con biopsia, puede notar un dolor leve y [place] durante 48 horas despus del procedimiento.  Evite usar Chiropodist, usar tampones y Art gallery manager sexuales durante 3 das luego del procedimiento o durante el tiempo que le haya indicado el mdico.  Comunquese con el mdico si tiene hemorragia, dolor intenso o signos de infeccin. Esta informacin no tiene Theme park manager el consejo del mdico. Asegrese de hacerle al mdico cualquier pregunta que tenga. Document Revised: 05/26/2017 Document Reviewed: 02/02/2013 Elsevier Patient Education  2020 Elsevier Inc. Displasia cervical Cervical Dysplasia  La displasia cervical es una afeccin que ocurre cuando una mujer presenta cambios anormales en las clulas del cuello uterino. El cuello uterino es la abertura del tero. Se encuentra entre la vagina y Careers information officer. La displasia cervical puede ser un signo temprano de cncer de cuello  uterino. Si no se trata, esta afeccin puede volverse ms grave y  progresar a cncer de cuello uterino. La deteccin temprana, el tratamiento y la atencin de seguimiento son Engineer, production. Cules son las causas? Una infeccin causada por el virus del papiloma humano (VPH) puede provocar la displasia cervical. La infeccin causada por el VPH es la infeccin de transmisin sexual (ITS) ms frecuente. El VPH se propaga de Neomia Dear persona a otra a travs del contacto sexual. Esto abarca la actividad sexual vaginal, oral y anal. Qu incrementa el riesgo? Los siguientes factores pueden hacer que sea ms propensa a Aeronautical engineer afeccin:  Despus de sufrir una infeccin de transmisin sexual (ITS), como herpes, clamidia o VPH.  Ser sexualmente activa antes de los 18 aos.  Haber tenido ms de una pareja sexual.  Winferd Humphrey pareja sexual que tiene mltiples parejas sexuales.  No usar proteccin, como un preservativo, durante las [address], especialmente con parejas sexuales nuevas.  Tener antecedentes de cncer en la vagina o en la vulva.  Tener el sistema de defensa del cuerpo (sistema inmunitario) debilitado.  Ser hija de una mujer que tom dietilestilbestrol (DES) durante el embarazo.  Tener antecedentes familiares de cncer de cuello del tero.  Consumo de cigarrillos.  Usar anticonceptivos orales, tambin llamados pldoras anticonceptivas.  Haber tenido tres o ms embarazos a trmino. Cules son los signos o los sntomas? Esta afeccin generalmente no presenta sntomas. Si tiene sntomas, pueden incluir los siguientes:  Secrecin vaginal anormal.  Sangrado entre los perodos menstruales o despus de Warehouse manager relaciones sexuales.  Sangrado durante la menopausia.  Dolor durante las relaciones sexuales (dispareunia). Cmo se diagnostica? Se puede realizar una prueba de Papanicolaou. Durante esta prueba, se extraen clulas del cuello uterino y, luego, se examinan con un microscopio. Tambin puede realizarse una prueba en la que se  extirpa tejido del cuello uterino (biopsia), si la prueba de Papanicolaou es anormal o si el aspecto del cuello del tero es anormal. Cmo se trata? El tratamiento vara segn la gravedad de la enfermedad. El tratamiento puede incluir:  Crioterapia. Durante la crioterapia, las clulas anormales se congelan con un instrumento con punta de acero.  Procedimiento de escisin electroquirrgica con asa (loop electrosurgical excision procedure, LEEP). Este es un procedimiento para extirpar tejido anormal del cuello uterino.  Ciruga para extirpar tejido anormal. Esta generalmente se realiza en los casos ms avanzados. Las opciones quirrgicas son: ? Biopsia en cono. Este es un procedimiento en el que se extirpan el canal cervical y Neomia Dear parte del centro del cuello uterino. ? Histerectoma. En esta ciruga se extirpan el tero y el cuello uterino. Siga estas instrucciones en su casa:  Tome los medicamentos de venta libre y los recetados solamente como se lo haya indicado el mdico.  No use tampones, duchas vaginales, ni tenga relaciones sexuales hasta que el mdico la autorice.  Concurra a las visitas de control como se lo haya indicado el mdico. Esto es importante. Las mujeres que han recibido tratamiento para la displasia cervical deberan someterse a pruebas de Papanicolaou y exmenes plvicos segn se los indique el mdico. Cmo se evita?  Practique sexo seguro para ayudar a prevenir las infecciones de transmisin sexual (ITS) que pueden causar esta afeccin.  Hgase pruebas de Papanicolaou de forma regular. Hable con el mdico sobre la frecuencia con la que debe hacerse estas pruebas. Las pruebas de Papanicolaou ayudan a Sports coach en las clulas que pueden Therapist, music. Comunquese con un mdico si:  Presenta verrugas  genitales. Solicite ayuda inmediatamente si:  Tiene fiebre.  Tiene una secrecin vaginal anormal.  El perodo menstrual es ms abundante que lo  normal.  Presenta un sangrado rojo brillante. Este puede incluir cogulos de [place].  Tiene dolor o clicos en el vientre que empeoran y no se alivian con los medicamentos.  Se siente mareada e inusualmente dbil.  Sufre episodios de [employer].  Tiene dolor en el abdomen. Resumen  La displasia cervical es una afeccin que ocurre cuando una mujer presenta cambios anormales en las clulas del cuello uterino.  Si no se trata, esta afeccin puede volverse ms grave y progresar a cncer de cuello uterino.  La deteccin temprana, el tratamiento y la atencin de seguimiento son muy importantes para Research officer, trade union.  Hgase exmenes plvicos y pruebas de Papanicolaou de forma regular. Hable con el mdico sobre la frecuencia con la que debe hacerse estas pruebas. Las pruebas de Papanicolaou ayudan a Sports coach en las clulas que pueden Therapist, music. Esta informacin no tiene Theme park manager el consejo del mdico. Asegrese de hacerle al mdico cualquier pregunta que tenga. Document Revised: 06/03/2018 Document Reviewed: 06/03/2018 Elsevier Patient Education  2020 [employer].

## 2020-05-16 LAB — SURGICAL PATHOLOGY

## 2020-05-20 ENCOUNTER — Telehealth: Payer: Self-pay

## 2020-05-20 NOTE — Telephone Encounter (Addendum)
-----   Message from Warden Fillers, MD sent at 05/20/2020  1:58 PM EDT ----- Cin 2 noted from biopsy, a LEEP will need to be  performed, will schedule   Called pt with [facility] interpreter Ida ID [phone]. Results and LEEP procedure reviewed with pt. Pt would not like to keep appt on 05/29/20 for further discussion. Explained we will contact pt with appt once we have all supplies for this procedure in the office.

## 2020-05-28 ENCOUNTER — Encounter: Payer: Self-pay | Admitting: General Practice

## 2020-05-29 ENCOUNTER — Other Ambulatory Visit (HOSPITAL_COMMUNITY)
Admission: RE | Admit: 2020-05-29 | Discharge: 2020-05-29 | Disposition: A | Discharge status: Home / Self Care | Payer: No Typology Code available for payment source | Source: Ambulatory Visit | Attending: Obstetrics and Gynecology | Admitting: Obstetrics and Gynecology

## 2020-05-29 ENCOUNTER — Encounter: Payer: Self-pay | Admitting: Obstetrics and Gynecology

## 2020-05-29 ENCOUNTER — Other Ambulatory Visit: Payer: Self-pay

## 2020-05-29 ENCOUNTER — Ambulatory Visit (INDEPENDENT_AMBULATORY_CARE_PROVIDER_SITE_OTHER): Payer: Self-pay | Admitting: Obstetrics and Gynecology

## 2020-05-29 DIAGNOSIS — N871 Moderate cervical dysplasia: Secondary | ICD-10-CM

## 2020-05-29 DIAGNOSIS — Z01812 Encounter for preprocedural laboratory examination: Secondary | ICD-10-CM

## 2020-05-29 LAB — POCT PREGNANCY, URINE: Preg Test, Ur: NEGATIVE

## 2020-05-29 NOTE — Progress Notes (Addendum)
Patient identified, informed consent obtained, signed copy in chart, time out performed.  Pap smear and colposcopy reviewed.    Urine pregnancy test was negative. 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  Cervix visualized. Paracervical block placed.  medium size LEEP/Cone apparatus used to remove cone of cervix using blend of cut and cautery on LEEP machine.  Edges/Base cauterized with Ball.  Monsel's solution used for hemostasis.  Patient tolerated procedure well.  Patient given post procedure instructions.  Follow up in 1 month for wound check then for repeat pap in 1 year.   Mariel Aloe, MD Faculty Attending, [facility]

## 2020-05-29 NOTE — Patient Instructions (Signed)
Procedimiento de escisin electroquirrgica con asa - Cuidados posteriores Loop Electrosurgical Excision Procedure El procedimiento de escisin electroquirrgica con asa (loop electrosurgical excision procedure, LEEP) es el corte y la extirpacin (escisin) de tejido del cuello uterino. El cuello uterino es la parte inferior del tero que se abre hacia la vagina. El tejido que se retira del cuello uterino se examina para determinar si hay clulas precancerosas o cancerosas. El LEEP puede realizarse en estos casos:  Tiene sangrado anormal del cuello uterino.  Usted tiene una prueba de Papanicolaou con resultado anormal.  Si el mdico encuentra una anormalidad en su cuello uterino durante un examen plvico. El LEEP normalmente solo demora algunos minutos y suele hacerse en el consultorio del mdico. El procedimiento es seguro para las mujeres que intentan quedar embarazadas. Sin embargo, el procedimiento generalmente no se realiza durante el perodo menstrual ni durante el embarazo. Informe al mdico acerca de lo siguiente:  Cualquier alergia que tenga.  Todos los [employer], incluidos vitaminas, hierbas, gotas oftlmicas, cremas y [address] de [address].  Cualquier trastorno de la sangre que tenga.  Cualquier afeccin que tenga, incluidas las infecciones vaginales actuales o anteriores, como herpes o infecciones de transmisin sexual (ITS).  Si est embarazada o podra estarlo.  Si tiene o no sangrado Architectural technologist del procedimiento. Cules son los riesgos? En general, se trata de un procedimiento seguro. Sin embargo, pueden ocurrir complicaciones, por ejemplo:  Infeccin.  Sangrado.  Reacciones alrgicas a los medicamentos.  Cambios o formacin de cicatrices en el cuello uterino.  Aumento del riesgo de parto temprano (prematuro) en futuros embarazos. Qu ocurre antes del procedimiento?  Consulte al mdico sobre: ? Multimedia programmer o suspender los medicamentos que  toma habitualmente. Esto es muy importante si toma medicamentos para la diabetes o anticoagulantes. ? Tomar medicamentos como aspirina e ibuprofeno. Estos medicamentos pueden tener un efecto anticoagulante en la [place]. No tome estos medicamentos a menos que el mdico se lo indique. ? Tomar medicamentos de [employer], vitaminas, hierbas y suplementos.  El mdico puede recomendarle que tome analgsicos antes del procedimiento.  Pregntele al mdico si debe hacer planes para que una persona la lleve de vuelta a su casa despus del procedimiento. Qu ocurre durante el procedimiento?   Le colocarn en la vagina un instrumento llamado espculo. Esto le permitir al mdico observar el cuello uterino.  Le administrarn un medicamento para adormecer la zona (anestesia local). El medicamento se inyectar en el cuello uterino y en la zona circundante.  Le aplicarn una solucin en el cuello uterino. Esta solucin ayudar al mdico a hallar las clulas anormales que deben ser extirpadas.  Le introducirn un asa de alambre delgada en la vagina. El alambre se usar para Astronomer (Holiday representative) el tejido cervical con corriente Radio producer.  Usted puede sentir que est por desmayarse durante el procedimiento. Informe a su mdico de inmediato si se siente as.  Se extirpar el tejido cervical anormal.  Si hay vasos sanguneos abiertos, se los cauterizar para evitar el sangrado.  Aplicarn una pasta en la zona cauterizada del cuello uterino para Printmaker.  La muestra de tejido cervical se examinar bajo un microscopio. El procedimiento puede variar segn el mdico y el hospital. Qu puedo esperar despus del procedimiento? Despus del procedimiento, es comn [employer] siguientes sntomas:  Calambres abdominales leves que son similares a los Medical illustrator. Estos pueden durar hasta 1 semana.  Una pequea secrecin vaginal con sangre o con un tono rosado, incluido un sangrado  de leve a  moderado, durante 1 o 2 semanas.  Una secrecin de color oscuro que sale de la vagina. Es la pasta que se coloca en el cuello uterino para Building control surveyor. Es su responsabilidad retirar [employer] del procedimiento. Pregntele a su mdico, o a un miembro del personal del departamento donde se realice el procedimiento, cundo estarn [employer]. Siga estas instrucciones en su casa:  Tome los medicamentos de venta libre y los recetados solamente como se lo haya indicado el mdico.  Retome sus actividades normales segn lo indicado por el mdico. Pregntele al mdico qu actividades son seguras para usted.  No introduzca nada en la vagina por 2semanas despus del procedimiento o hasta que el mdico la autorice. Esto incluye los tampones, las cremas y las duchas vaginales.  No tenga sexo hasta que el mdico lo autorice.  Concurra a todas las visitas de [address] se lo haya indicado el mdico. Esto es importante. Comunquese con un mdico si:  Tiene fiebre o escalofros.  Se siente dbil de un modo que no es habitual.  Tiene un sangrado vaginal que es ms abundante o que dura ms que el ciclo menstrual normal. Un signo de esto puede ser empapar de sangre un apsito o tener sangrado con cogulos.  Comienza a Counselling psychologist secrecin vaginal con mal olor.  Tiene dolor o clicos intensos en el abdomen. Resumen  El procedimiento de escisin electroquirrgica con asa (LEEP) es la extirpacin de tejido del cuello uterino. El tejido extirpado ser examinado para detectar si tiene clulas precancerosas o cancerosas.  El LEEP normalmente solo demora algunos minutos y suele hacerse en el consultorio del mdico.  No introduzca nada en la vagina por 2semanas despus del procedimiento o hasta que el mdico la autorice. Esto incluye los tampones, las cremas y las duchas vaginales.  Concurra a todas las visitas de seguimiento como se lo haya indicado el mdico. Pregntele a su  mdico, o a un miembro del personal del departamento donde se realice el procedimiento, cundo estarn [employer]. Esta informacin no tiene Theme park manager el consejo del mdico. Asegrese de hacerle al mdico cualquier pregunta que tenga. Document Revised: 08/31/2018 Document Reviewed: 08/31/2018 Elsevier Patient Education  2020 [employer].

## 2020-05-31 LAB — SURGICAL PATHOLOGY

## 2020-06-05 ENCOUNTER — Encounter: Payer: Self-pay | Admitting: *Deleted

## 2020-06-27 ENCOUNTER — Other Ambulatory Visit: Payer: Self-pay

## 2020-06-27 ENCOUNTER — Ambulatory Visit (INDEPENDENT_AMBULATORY_CARE_PROVIDER_SITE_OTHER): Payer: Self-pay | Admitting: Obstetrics and Gynecology

## 2020-06-27 ENCOUNTER — Encounter: Payer: Self-pay | Admitting: Obstetrics and Gynecology

## 2020-06-27 DIAGNOSIS — N871 Moderate cervical dysplasia: Secondary | ICD-10-CM | POA: Insufficient documentation

## 2020-06-27 NOTE — Patient Instructions (Addendum)
Displasia cervical Cervical Dysplasia  La displasia cervical es una afeccin que ocurre cuando una mujer presenta cambios anormales en las clulas del cuello uterino. El cuello uterino es la abertura del tero. Se encuentra entre la vagina y Careers information officer. La displasia cervical puede ser un signo temprano de cncer de cuello uterino. Si no se trata, esta afeccin puede volverse ms grave y progresar a cncer de cuello uterino. La deteccin temprana, el tratamiento y la atencin de seguimiento son Engineer, production. Cules son las causas? Una infeccin causada por el virus del papiloma humano (VPH) puede provocar la displasia cervical. La infeccin causada por el VPH es la infeccin de transmisin sexual (ITS) ms frecuente. El VPH se propaga de Neomia Dear persona a otra a travs del contacto sexual. Esto abarca la actividad sexual vaginal, oral y anal. Qu incrementa el riesgo? Los siguientes factores pueden hacer que sea ms propensa a Aeronautical engineer afeccin:  Despus de sufrir una infeccin de transmisin sexual (ITS), como herpes, clamidia o VPH.  Ser sexualmente activa antes de los 18 aos.  Haber tenido ms de una pareja sexual.  Winferd Humphrey pareja sexual que tiene mltiples parejas sexuales.  No usar proteccin, como un preservativo, durante las [address], especialmente con parejas sexuales nuevas.  Tener antecedentes de cncer en la vagina o en la vulva.  Tener el sistema de defensa del cuerpo (sistema inmunitario) debilitado.  Ser hija de una mujer que tom dietilestilbestrol (DES) durante el embarazo.  Tener antecedentes familiares de cncer de cuello del tero.  Consumo de cigarrillos.  Usar anticonceptivos orales, tambin llamados pldoras anticonceptivas.  Haber tenido tres o ms embarazos a trmino. Cules son los signos o los sntomas? Esta afeccin generalmente no presenta sntomas. Si tiene sntomas, pueden incluir los siguientes:  Secrecin vaginal  anormal.  Sangrado entre los perodos menstruales o despus de Warehouse manager relaciones sexuales.  Sangrado durante la menopausia.  Dolor durante las relaciones sexuales (dispareunia). Cmo se diagnostica? Se puede realizar una prueba de Papanicolaou. Durante esta prueba, se extraen clulas del cuello uterino y, luego, se examinan con un microscopio. Tambin puede realizarse una prueba en la que se extirpa tejido del cuello uterino (biopsia), si la prueba de Papanicolaou es anormal o si el aspecto del cuello del tero es anormal. Cmo se trata? El tratamiento vara segn la gravedad de la enfermedad. El tratamiento puede incluir:  Crioterapia. Durante la crioterapia, las clulas anormales se congelan con un instrumento con punta de acero.  Procedimiento de escisin electroquirrgica con asa (loop electrosurgical excision procedure, LEEP). Este es un procedimiento para extirpar tejido anormal del cuello uterino.  Ciruga para extirpar tejido anormal. Esta generalmente se realiza en los casos ms avanzados. Las opciones quirrgicas son: ? Biopsia en cono. Este es un procedimiento en el que se extirpan el canal cervical y Neomia Dear parte del centro del cuello uterino. ? Histerectoma. En esta ciruga se extirpan el tero y el cuello uterino. Siga estas instrucciones en su casa:  Tome los medicamentos de venta libre y los recetados solamente como se lo haya indicado el mdico.  No use tampones, duchas vaginales, ni tenga relaciones sexuales hasta que el mdico la autorice.  Concurra a las visitas de control como se lo haya indicado el mdico. Esto es importante. Las mujeres que han recibido tratamiento para la displasia cervical deberan someterse a pruebas de Papanicolaou y exmenes plvicos segn se los indique el mdico. Cmo se evita?  Practique sexo seguro para ayudar a prevenir las infecciones de transmisin sexual (  ITS) que pueden causar esta afeccin.  Hgase pruebas de Papanicolaou de forma  regular. Hable con el mdico sobre la frecuencia con la que debe hacerse estas pruebas. Las pruebas de Papanicolaou ayudan a Sports coach en las clulas que pueden Therapist, music. Comunquese con un mdico si:  Presenta verrugas genitales. Solicite ayuda inmediatamente si:  Tiene fiebre.  Tiene una secrecin vaginal anormal.  El perodo menstrual es ms abundante que lo normal.  Presenta un sangrado rojo brillante. Este puede incluir cogulos de Moore.  Tiene dolor o clicos en el vientre que empeoran y no se alivian con los medicamentos.  Se siente mareada e inusualmente dbil.  Sufre episodios de [employer].  Tiene dolor en el abdomen. Resumen  La displasia cervical es una afeccin que ocurre cuando una mujer presenta cambios anormales en las clulas del cuello uterino.  Si no se trata, esta afeccin puede volverse ms grave y progresar a cncer de cuello uterino.  La deteccin temprana, el tratamiento y la atencin de seguimiento son muy importantes para Research officer, trade union.  Hgase exmenes plvicos y pruebas de Papanicolaou de forma regular. Hable con el mdico sobre la frecuencia con la que debe hacerse estas pruebas. Las pruebas de Papanicolaou ayudan a Sports coach en las clulas que pueden Therapist, music. Esta informacin no tiene Theme park manager el consejo del mdico. Asegrese de hacerle al mdico cualquier pregunta que tenga. Document Revised: 06/03/2018 Document Reviewed: 06/03/2018 Elsevier Patient Education  2020 [employer].  Virus del papiloma humano Human Papillomavirus El virus del papiloma humano (VPH) es la infeccin de transmisin sexual (ITS) ms frecuente. Se transmite fcilmente de persona a persona (es muy contagioso). Hay varios tipos de VPH. A menudo, el VPH no causa sntomas. A veces puede causar verrugas genitales que pueden verse y sentirse. Es posible que haya lesiones similares a las Microbiologist. Puede tener el  VPH durante mucho tiempo y no saberlo. Puede transmitir el VPH a otras personas sin saberlo. Ciertos tipos de Clinical research associate. Cules son las causas? El VPH es causado por un virus que se propaga de Neomia Dear persona a otra a travs del contacto sexual. Ladell Heads incrementa el riesgo? Puede tener ms probabilidades de contraer el VPH si tiene o ha tenido lo siguiente:  Relaciones sexuales sin proteccin de Publishing copy.  Varias parejas sexuales.  Neomia Dear pareja sexual que tiene Teaching laboratory technician.  Otras ITS.  Un sistema que combate las enfermedades (sistema inmunitario) debilitado.  Piel daada en la zona genital, oral o anal. Cules son los signos o los sntomas? La [employer] de las personas que tienen el VPH no presentan ningn sntoma. Si tiene sntomas, estos pueden incluir los siguientes:  Lesiones similares a Chief Financial Officer garganta por practicar sexo oral.  Verrugas en las zonas infectadas.  Verrugas genitales que Engineer, manufacturing, arder, Geophysicist/field seismologist o doler durante las [employer]. Cmo se trata? No existe un tratamiento para el propio virus. Sin embargo, hay tratamientos para los problemas de salud y los sntomas que puede Engineer, structural. El mdico puede tratar el VPH mediante lo siguiente:  Administracin de medicamentos que sean cremas, lociones, lquidos o geles. Estos medicamentos pueden inyectarse o aplicarse en las verrugas genitales o anales.  Aplicacin en las verrugas genitales o anales de: ? Fro extremo. ? Un haz de luz intenso (tratamiento con lser). ? Calor extremo.  Ciruga para extirpar las verrugas genitales o anales. El mdico lo controlar detenidamente despus del tratamiento. El VPH  puede reaparecer y es posible que requiera tratamiento nuevamente. Siga estas instrucciones en su casa: Medicamentos  Tome los medicamentos de venta libre y los recetados solamente como se lo haya indicado el mdico.  No trate las verrugas genitales con los  medicamentos utilizados para las verrugas de las manos. Instrucciones generales  No toque ni rasque las verrugas.  No mantenga relaciones sexuales durante el tratamiento.  No utilice tampones ni duchas vaginales durante el tratamiento (en el caso de las mujeres).  Hable con sus pareja sexual sobre su infeccin. Es posible que su pareja tambin necesite Cricket.  Si queda embarazada, informe a su mdico que [employer]. El mdico la controlar durante el [place].  Concurra a todas las visitas de [address] se lo haya indicado el mdico. Esto es importante. Cmo se evita?  Hable con el mdico sobre recibir Geophysical data processor VPH. Esto puede evitar algunas infecciones por el VPH y cnceres relacionados. Es posible que necesite 2 o 3 dosis de la vacuna, segn su edad. ? La vacuna no surtir efecto si ya [employer]. ? La vacuna no se recomienda en mujeres embarazadas.  Despus del tratamiento, use preservativos cuando Control and instrumentation engineer. Esto ayuda en la prevencin de futuras infecciones.  Tenga solo una pareja sexual.  No tenga Neomia Dear pareja sexual que tenga otras parejas sexuales.  Asegrese de realizarse la prueba de Papanicolaou como se lo haya indicado el mdico. Comunquese con un mdico si:  La piel tratada se enrojece, se hincha o duele.  Tiene fiebre.  Se siente enfermo.  Palpa bultos o granos en la zona genital o anal, o en su alrededor.  Tiene una hemorragia vaginal.  Tiene hemorragia en la zona que recibi tratamiento.  Siente dolor durante las [employer]. Resumen  El VPH es la ITS ms frecuente. El virus se transmite fcilmente de Neomia Dear persona a [place].  A menudo, el VPH no causa ningn sntoma.  La vacuna contra el VPH previene algunas infecciones por el VPH y cnceres relacionados.  No existe un tratamiento para el propio virus. Sin embargo, hay tratamientos para los problemas de salud y los sntomas que puede Geographical information systems officer. Esta informacin no tiene Theme park manager el consejo del mdico. Asegrese de hacerle al mdico cualquier pregunta que tenga. Document Revised: 04/08/2018 Document Reviewed: 04/08/2018 Elsevier Patient Education  2020 [employer].

## 2020-06-27 NOTE — Progress Notes (Signed)
   Subjective:  CC:  LEEP f/u  Patient ID: Briana Shelton, female    DOB: 1990-08-01, 29 y.o.   MRN: [id number]  HPI LEEP f/u performed.  Pt has had no issues since the procedure.  She did note some spotting, but she also has a nexplanon device.  Reviewed pathology findings and etiology of HPV with the patient.  CIN 1-2 noted with negative margins.   Review of Systems     Objective:   Physical Exam Vitals:   06/27/20 0852  BP: 123/86  Pulse: 77   SVE: Cvx well healed.  No granulation tissue or defects.      Assessment & Plan:   1. Dysplasia of cervix, high grade CIN 2 Pt aware of level of dysplasia, per current guidelines, repap with HPV cotesting in 1 year    Warden Fillers, MD Faculty Attending, [facility] for [facility]

## 2020-06-28 ENCOUNTER — Telehealth: Payer: Self-pay

## 2020-06-28 NOTE — Telephone Encounter (Signed)
Via Delorise Royals, Spanish Interpreter, Patient's [place] citizenship as patient's 05/13/2020 Colpo results revealed HGSIL, CIN-II, had LEEP on 05/29/20, also revealed HGSIL, CIN-II, needs to know if eligible for medicaid. Patient stated she is not a citizen or a resident, has already applied for [employer] Assistance received 100% assistance. Patient informed to disregard our call, and to make sure the [facility] knows she has received financial assistance. Patient verbalized understanding.

## 2020-10-18 IMAGING — US US BREAST*L* LIMITED INC AXILLA
1 series · 12 of 17 positions shown · non-contrast
Comparison: Previous exam(s).
COMPARISON: Previous exam(s).

Addendum:
CLINICAL DATA: Patient for short-term follow-up of left breast
abscess. Only scant fluid was able to be aspirated on 07/06/2019.
Patient was seen on 07/17/2019 with improved clinical
symptomatology. For follow-up exam today. Patient reports no warmth
or tenderness at this site.

EXAM:
ULTRASOUND OF THE LEFT BREAST

[Series 1: us breast*left* limited inc axilla · 0.06mm/px · 12 of 17 slices shown]
[im 1/17]
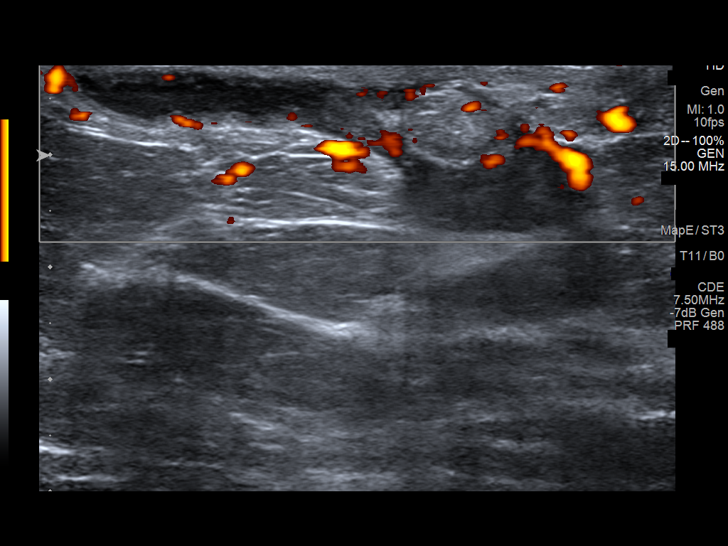
[im 3/17]
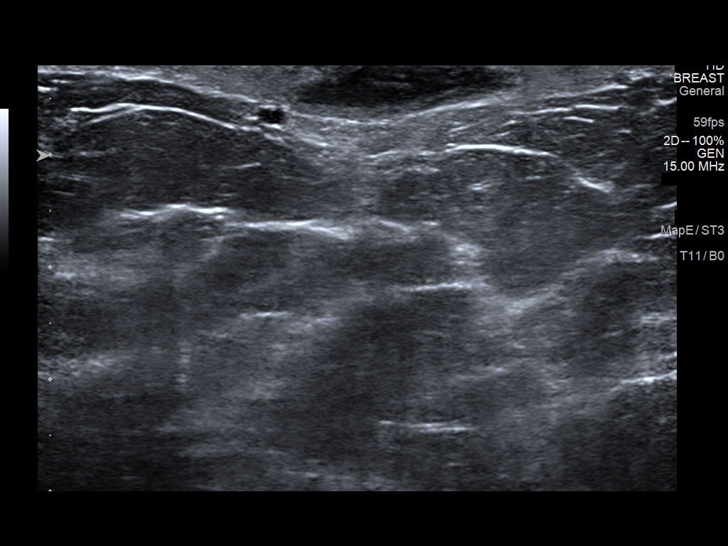
[im 4/17]
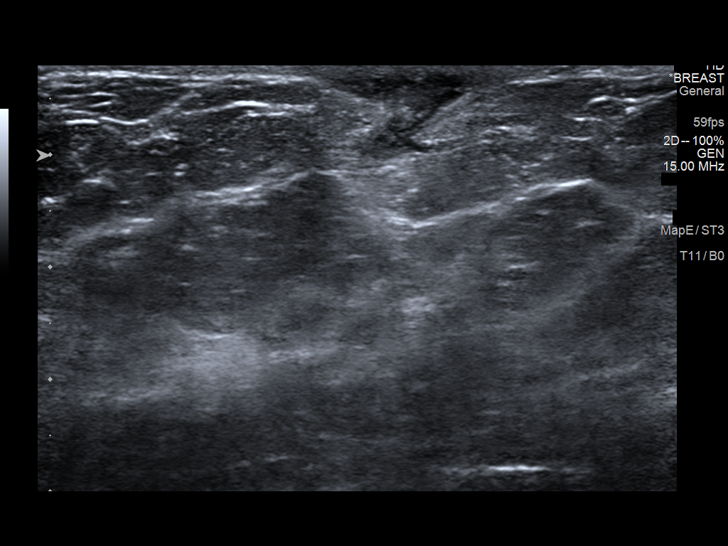
[im 5/17]
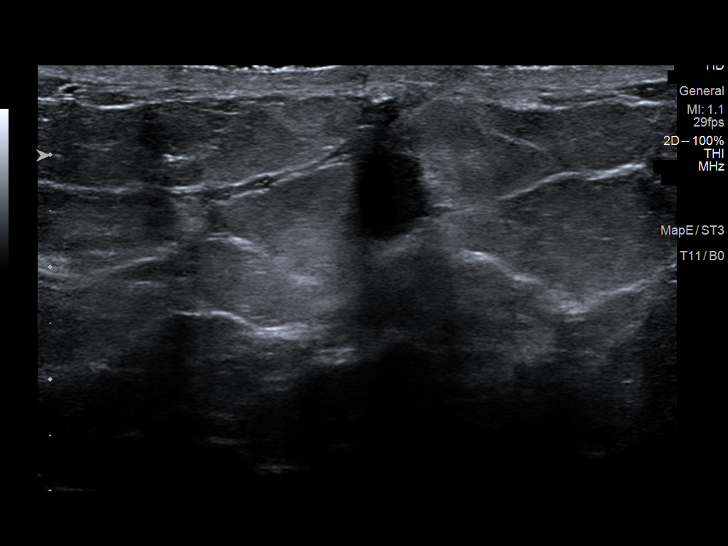
[im 7/17]
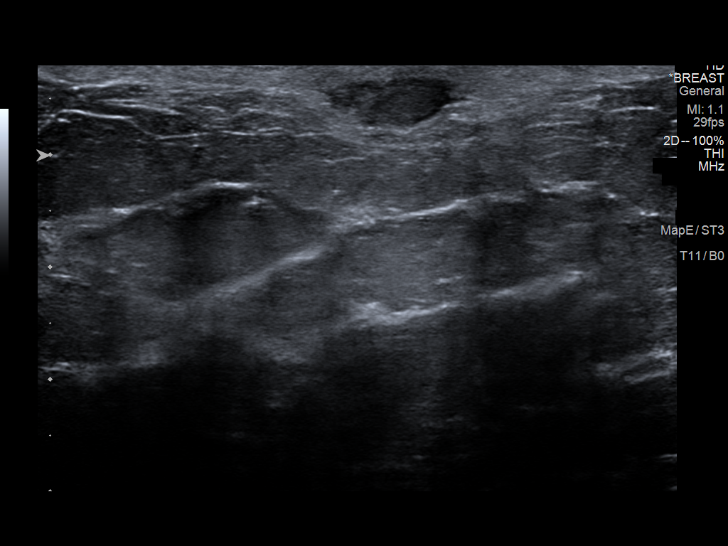
[im 8/17]
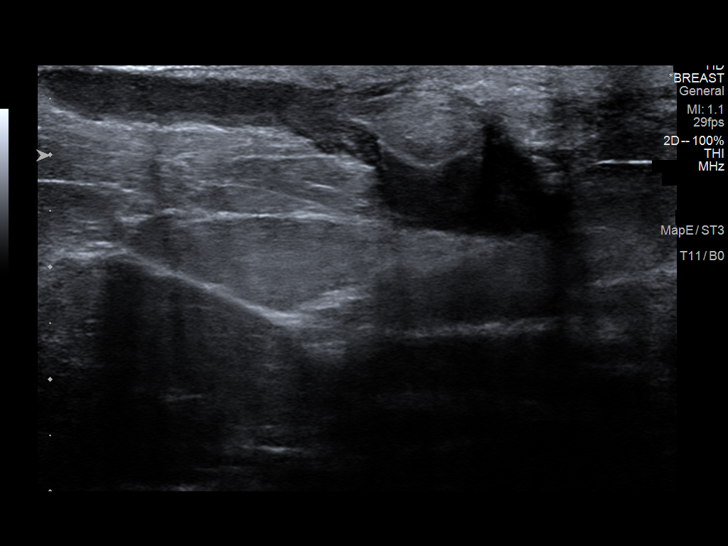
[im 10/17]
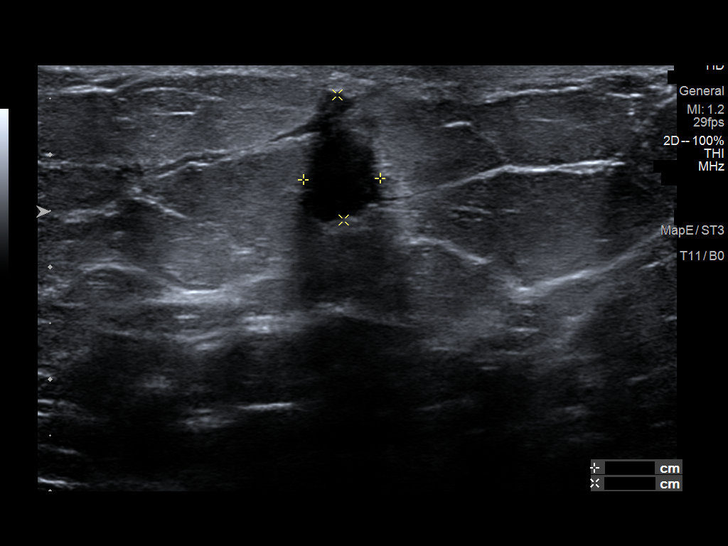
[im 11/17]
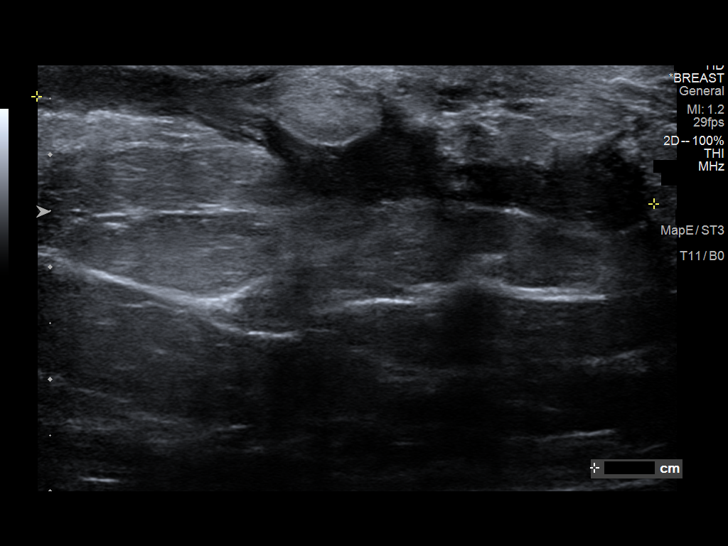
[im 13/17]
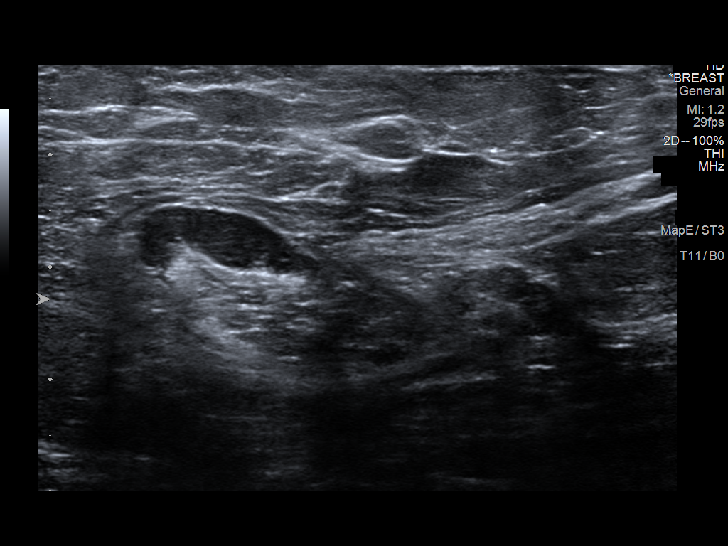
[im 14/17]
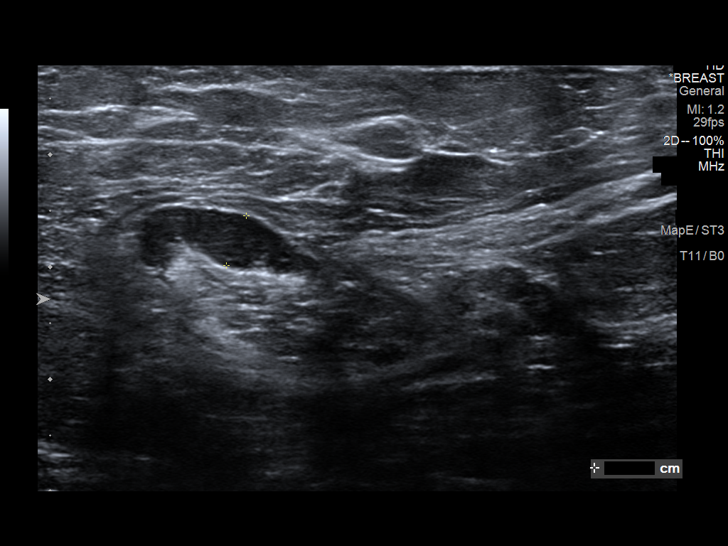
[im 15/17]
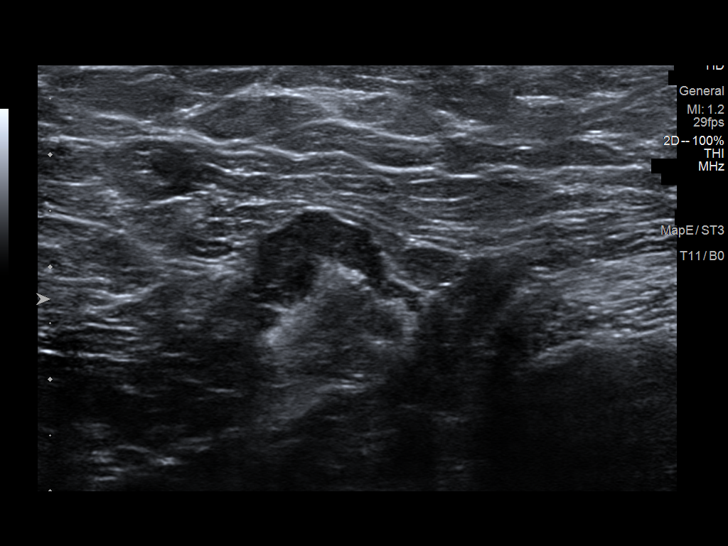
[im 17/17]
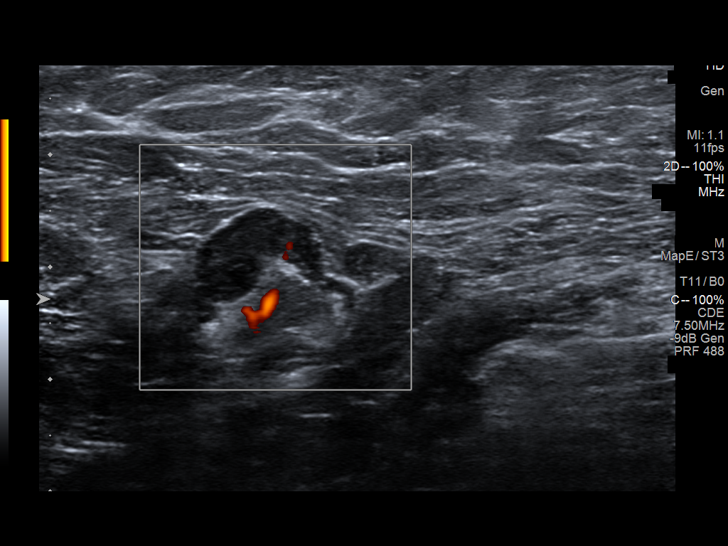

[12 of 17 positions shown; findings below may reference images not displayed]

FINDINGS: On physical exam, there is a small round area of discoloration just
medial to the left nipple.

Targeted ultrasound is performed, showing a complex hypoechoic
collection located within the skin left breast 2 o'clock position
measuring 2.8 x 0.5 cm. This appears to connect with a more
phlegmonous hypoechoic area within the 9 o'clock position
retroareolar location left breast which measures 2.4 x 1.0 x 0.7 cm.

Persistent cortically thickened left axillary lymph node measuring
up to 5 mm.
IMPRESSION: 1. Interval increase in complex fluid located within the skin
overlying the left breast 9 o'clock position 2 cm from nipple with
overlying cutaneous discoloration. While this may represent a
sterile fluid collection, given the associated discoloration of the
overlying skin and the fact that the majority of this fluid is
located within the skin, surgical consultation is recommended.

RECOMMENDATION:
Surgical consultation for drainage of predominately intracutaneous
hypoechoic fluid collection overlying the left breast 9 o'clock
position 2 cm from nipple which may represent persistent abscess or
sterile collection.

Follow-up left breast ultrasound in 3-4 weeks after procedure to
ensure complete resolution of intracutaneous fluid collection,
phlegmonous change within the left breast retroareolar location and
likely reactive left axillary lymph node.

I have discussed the findings and recommendations with the patient.
If applicable, a reminder letter will be sent to the patient
regarding the next appointment.

BI-RADS CATEGORY  3: Probably benign.

ADDENDUM:
Surgical consultation has been arranged with Dr. Gargi Tiger at
[REDACTED] on August 22, 2019. Amazigh, Quirijn Interpreter notified patient regarding upcoming appointment.

Jagger Fransisco RN on 08/09/2019.

*** End of Addendum ***
FINDINGS: On physical exam, there is a small round area of discoloration just
medial to the left nipple.

Targeted ultrasound is performed, showing a complex hypoechoic
collection located within the skin left breast 2 o'clock position
measuring 2.8 x 0.5 cm. This appears to connect with a more
phlegmonous hypoechoic area within the 9 o'clock position
retroareolar location left breast which measures 2.4 x 1.0 x 0.7 cm.

Persistent cortically thickened left axillary lymph node measuring
up to 5 mm.
IMPRESSION: 1. Interval increase in complex fluid located within the skin
overlying the left breast 9 o'clock position 2 cm from nipple with
overlying cutaneous discoloration. While this may represent a
sterile fluid collection, given the associated discoloration of the
overlying skin and the fact that the majority of this fluid is
located within the skin, surgical consultation is recommended.

RECOMMENDATION:
Surgical consultation for drainage of predominately intracutaneous
hypoechoic fluid collection overlying the left breast 9 o'clock
position 2 cm from nipple which may represent persistent abscess or
sterile collection.

Follow-up left breast ultrasound in 3-4 weeks after procedure to
ensure complete resolution of intracutaneous fluid collection,
phlegmonous change within the left breast retroareolar location and
likely reactive left axillary lymph node.

I have discussed the findings and recommendations with the patient.
If applicable, a reminder letter will be sent to the patient
regarding the next appointment.

BI-RADS CATEGORY  3: Probably benign.

## 2021-05-08 ENCOUNTER — Encounter: Payer: Self-pay | Admitting: *Deleted

## 2021-05-13 ENCOUNTER — Ambulatory Visit: Payer: Self-pay | Admitting: *Deleted

## 2021-05-13 ENCOUNTER — Other Ambulatory Visit: Payer: Self-pay

## 2021-05-13 VITALS — BP 122/74 | Wt 158.4 lb

## 2021-05-13 DIAGNOSIS — Z1239 Encounter for other screening for malignant neoplasm of breast: Secondary | ICD-10-CM

## 2021-05-13 DIAGNOSIS — R87810 Cervical high risk human papillomavirus (HPV) DNA test positive: Secondary | ICD-10-CM

## 2021-05-13 NOTE — Progress Notes (Signed)
Briana Shelton is a 30 y.o. female who presents to [facility] clinic today with no complaints. Patient referred to B[facility]CP by the [facility] due to having a normal Pap smear that was positive HPV 04/22/2021 that a colposcopy is recommended for follow-up.   Pap Smear: Pap smear completed today. Last Pap smear was 04/22/2021 at the [facility] clinic and was normal with positive HPV. Patient has history of one other abnormal Pap smear 02/09/2020 that was LSIL that a colposcopy was completed 05/13/2020 that showed in CIN-II. Last Pap smear result is available in Epic.   Physical exam: Breasts Right breast greater than left breast due to history of left breast lumpectomy 12/20/2019. No skin abnormalities right breast. Scar observed left breast at 9 o'clock next to nipple due to history of lumpectomy. No nipple retraction bilateral breasts. No nipple discharge bilateral breasts. No lymphadenopathy. No lumps palpated bilateral breasts. No complaints of pain or tenderness on exam. Let patient know that a screening mammogram is recommended at age 17 unless clinically indicated prior.  Pelvic/Bimanual Pap is not indicated today per BCCCP guidelines.    Smoking History: Patient has never smoked.  Patient Navigation: Patient education provided. Access to services provided for patient through [employer] program. Used Spanish interpreter Briana Shelton from [facility] provided.   Breast and Cervical Cancer Risk Assessment: Patient has a family history of her mother having breast cancer. Patient has no known genetic mutations or history of radiation treatment to the chest before age 78. Patient has no history of cervical dysplasia, immunocompromised, or DES exposure in-utero. Breast cancer risk assessment completed. No breast cancer risk calculated due to patient is less than 41 years old.  Risk Assessment     Risk Scores       05/13/2021 07/06/2019   Last edited by:  Narda Rutherford, LPN Lashe Oliveira, Carlye Grippe, RN   5-year risk:     Lifetime risk:              A: BCCCP exam without pap smear No complaints.  P: Referred patient to the [facility] for [facility] for a colposcopy to follow-up for her abnormal Pap smear. Appointment scheduled Monday, June 02, 2021 at 1015.  Priscille Heidelberg, RN 05/13/2021 2:04 PM

## 2021-05-13 NOTE — Patient Instructions (Signed)
Explained breast self awareness with Briana Shelton. Patient did not need a Pap smear today due to last Pap smear was 04/22/2021. Explained the colposcopy the recommended follow-up for her abnormal Pap smear. Referred patient to the [facility] for [facility] for a colposcopy to follow-up for her abnormal Pap smear. Appointment scheduled Monday, June 02, 2021 at 1015. Patient aware of appointment and will be there. Let patient know a screening mammogram is recommended at age 59 unless clinically indicated prior. Briana Shelton verbalized understanding.  Briana Shelton, Briana Maser, RN 2:04 PM

## 2021-06-02 ENCOUNTER — Ambulatory Visit: Payer: Self-pay | Admitting: Family Medicine

## 2021-06-23 ENCOUNTER — Other Ambulatory Visit: Payer: Self-pay

## 2021-06-23 ENCOUNTER — Encounter: Payer: Self-pay | Admitting: Obstetrics & Gynecology

## 2021-06-23 ENCOUNTER — Ambulatory Visit (INDEPENDENT_AMBULATORY_CARE_PROVIDER_SITE_OTHER): Payer: Self-pay | Admitting: Obstetrics & Gynecology

## 2021-06-23 ENCOUNTER — Other Ambulatory Visit (HOSPITAL_COMMUNITY)
Admission: RE | Admit: 2021-06-23 | Discharge: 2021-06-23 | Disposition: A | Discharge status: Home / Self Care | Payer: Self-pay | Source: Ambulatory Visit | Attending: Family Medicine | Admitting: Family Medicine

## 2021-06-23 VITALS — BP 124/56 | HR 68 | Wt 156.0 lb

## 2021-06-23 DIAGNOSIS — N871 Moderate cervical dysplasia: Secondary | ICD-10-CM | POA: Insufficient documentation

## 2021-06-23 NOTE — Progress Notes (Signed)
Patient ID: Briana Shelton, female   DOB: 1990/10/29, 30 y.o.   MRN: [id number]  Chief Complaint  Patient presents with   Procedure    HPI Briana Shelton is a 30 y.o. female.  [id number] No LMP recorded. Patient has had an implant. S/p pap with neg cytology and positive HR HPV HPI  Indications: Pap smear on October 2022 showed:  HR HPV . Previous colposcopy: CIN 2 and in 05/2020. Prior cervical treatment:  LEEP .  Past Medical History:  Diagnosis Date   Pregnancy induced hypertension     Past Surgical History:  Procedure Laterality Date   BREAST LUMPECTOMY Left 12/20/2019   Procedure: LEFT BREAST LUMPECTOMY;  Surgeon: Briana Bouillon, MD;  Location: [facility];  Service: General;  Laterality: Left;   CESAREAN SECTION      Family History  Problem Relation Age of Onset   Breast cancer Mother     Social History Social History   Tobacco Use   Smoking status: Never   Smokeless tobacco: Never  Vaping Use   Vaping Use: Never used  Substance Use Topics   Alcohol use: Yes    Comment: occasionally   Drug use: No    No Known Allergies  Current Outpatient Medications  Medication Sig Dispense Refill   ibuprofen (ADVIL) 800 MG tablet Take 1 tablet (800 mg total) by mouth every 8 (eight) hours as needed. (Patient not taking: Reported on 06/27/2020) 30 tablet 0   oxyCODONE (OXY IR/ROXICODONE) 5 MG immediate release tablet Take 1 tablet (5 mg total) by mouth every 6 (six) hours as needed for severe pain. (Patient not taking: Reported on 06/27/2020) 15 tablet 0   No current facility-administered medications for this visit.    Review of Systems Review of Systems  Blood pressure (!) 124/56, pulse 68, weight 156 lb (70.8 kg).  Physical Exam Physical Exam Vitals and nursing note reviewed. Exam conducted with a chaperone present.  Genitourinary:    General: Normal vulva.     Vagina: Normal.     Cervix: Normal.   Patient given informed consent,  signed copy in the chart, time out was performed.  Placed in lithotomy position. Cervix viewed with speculum and colposcope after application of acetic acid.   Colposcopy adequate?  yes Acetowhite lesions?no Punctation?no Mosaicism?  no Abnormal vasculature?  no Biopsies?600 ECC?yes  COMMENTS:     Assessment    Procedure Details  The risks and benefits of the procedure and Written informed consent obtained.  Speculum placed in vagina and excellent visualization of cervix achieved, cervix swabbed x 3 with acetic acid solution. Dysplasia of cervix, high grade CIN 2 - Plan: Surgical pathology  Specimens: ECC and Bx  Complications: none.     Plan    Specimens labelled and sent to Pathology. Return to discuss Pathology results in 2 weeks.      Briana Shelton 06/23/2021, 4:18 PM

## 2021-06-30 LAB — SURGICAL PATHOLOGY

## 2021-07-15 ENCOUNTER — Telehealth: Payer: Self-pay

## 2021-07-15 NOTE — Telephone Encounter (Addendum)
-----   Message from Adam Phenix, MD sent at 07/14/2021 11:16 AM EST ----- May repeat pap in 1 year  Called pt with [facility] Interpreter # [phone] informed pt her results and that she will follow up with BCCCP with a pap smear if she does not have insurance.  Pt verbalized understanding to calling in October 2023 to schedule pap smear.    Briana Shelton  07/15/21

## 2022-06-29 ENCOUNTER — Other Ambulatory Visit: Payer: Self-pay | Admitting: Hematology and Oncology

## 2022-06-29 DIAGNOSIS — Z124 Encounter for screening for malignant neoplasm of cervix: Secondary | ICD-10-CM

## 2022-06-29 NOTE — Progress Notes (Signed)
Patient: Briana Shelton           Date of Birth: 27-Nov-1990           MRN: [id number] Visit Date: 06/29/2022 PCP: Patient, No Pcp Per     Cervical Exam  Abnormal Observations: Normal exam Recommendations: Will repeat Pap smear next year for persistent HPV; will refer for colpo if cytology abnormal with positive HPV.      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   Mild cervical dysplasia 05/13/2020   Screening breast examination 07/06/2019   Breast lump on left side at 9 o'clock position 07/06/2019   Indication for care in labor or delivery 01/03/2014   NVD (normal vaginal delivery) 01/03/2014   Past Medical History:  Diagnosis Date   Pregnancy induced hypertension     Family History  Problem Relation Age of Onset   Breast cancer Mother     Social History   Occupational History   Not on file  Tobacco Use   Smoking status: Never   Smokeless tobacco: Never  Vaping Use   Vaping Use: Never used  Substance and Sexual Activity   Alcohol use: Yes    Comment: occasionally   Drug use: No   Sexual activity: Yes    Birth control/protection: Implant

## 2022-07-02 LAB — CYTOLOGY - PAP
Adequacy: ABSENT
Comment: NEGATIVE
Comment: NEGATIVE
Comment: NEGATIVE
HPV 16: POSITIVE — AB
HPV 18 / 45: NEGATIVE
High risk HPV: POSITIVE — AB

## 2022-07-06 ENCOUNTER — Telehealth: Payer: Self-pay

## 2022-07-06 NOTE — Telephone Encounter (Signed)
Patient returned call about pap smear results. Informed patient via [name], Haroldine Laws that pap smear showed LSIL and was positive for HPV. Based on this result a colpo is recommended for FU. Patient voiced understanding. Patient is scheduled for BCCCP and Colpo on 09/01/21.

## 2022-07-06 NOTE — Telephone Encounter (Signed)
Attempted to contact patient to give pap results. Left voice mail via [name] with name and phone # to return phone call.

## 2022-09-01 ENCOUNTER — Ambulatory Visit: Payer: Self-pay | Admitting: Hematology and Oncology

## 2022-09-01 ENCOUNTER — Other Ambulatory Visit (HOSPITAL_COMMUNITY)
Admission: RE | Admit: 2022-09-01 | Discharge: 2022-09-01 | Disposition: A | Discharge status: Home / Self Care | Payer: Self-pay | Source: Ambulatory Visit | Attending: Obstetrics and Gynecology | Admitting: Obstetrics and Gynecology

## 2022-09-01 VITALS — BP 122/76 | Wt 184.7 lb

## 2022-09-01 DIAGNOSIS — R87612 Low grade squamous intraepithelial lesion on cytologic smear of cervix (LGSIL): Secondary | ICD-10-CM

## 2022-09-01 DIAGNOSIS — N72 Inflammatory disease of cervix uteri: Secondary | ICD-10-CM | POA: Insufficient documentation

## 2022-09-01 DIAGNOSIS — B977 Papillomavirus as the cause of diseases classified elsewhere: Secondary | ICD-10-CM

## 2022-09-01 DIAGNOSIS — Z01812 Encounter for preprocedural laboratory examination: Secondary | ICD-10-CM

## 2022-09-01 LAB — PREGNANCY, URINE

## 2022-09-01 NOTE — Progress Notes (Signed)
[facility]   GYNECOLOGY CLINIC COLPOSCOPY PROCEDURE NOTE  Ms. Ayanna Katsnelson is a 32 y.o. [phone] here for colposcopy for low-grade squamous intraepithelial neoplasia (LGSIL - encompassing HPV,mild dysplasia,CIN I) pap smear on 06/29/22. Discussed role for HPV in cervical dysplasia, need for surveillance.  Patient given informed consent, signed copy in the chart, time out was performed.  Placed in lithotomy position. Cervix viewed with speculum and colposcope after application of acetic acid.   Colposcopy adequate? Yes  no visible lesions.  ECC specimen obtained. All specimens were labelled and sent to pathology.  Patient was given post procedure instructions.  Will follow up pathology and manage accordingly.  Routine preventative health maintenance measures emphasized.   Melodye Ped, NP 09/01/2022 2:11 PM

## 2022-09-02 NOTE — Progress Notes (Signed)
[name] is a 32 y.o. female who presents to [facility] clinic today with no complaints.    Pap Smear: Pap not smear completed today. Last Pap smear was 12/23 and was abnormal - LGSIL/ HPV+ . Per patient has history of an abnormal Pap smear. Last Pap smear result is available in Epic.   Physical exam: Breasts Breasts symmetrical. No skin abnormalities bilateral breasts. No nipple retraction bilateral breasts. No nipple discharge bilateral breasts. No lymphadenopathy. No lumps palpated bilateral breasts.       Pelvic/Bimanual Pap is not indicated today    Smoking History: Patient has never smoked and was not referred to quit line.    Patient Navigation: Patient education provided. Access to services provided for patient through [place] interpreter provided. No transportation provided   Colorectal Cancer Screening: Per patient has never had colonoscopy completed No complaints today.    Breast and Cervical Cancer Risk Assessment: Patient does not have family history of breast cancer, known genetic mutations, or radiation treatment to the chest before age 79. Patient has history of cervical dysplasia, immunocompromised, or DES exposure in-utero.  Risk Assessment   No risk assessment data for the current encounter  Risk Scores       05/13/2021   Last edited by: Demetrius Revel, LPN   5-year risk:    Lifetime risk:             A: [facility] exam without pap smear No complaints with benign breast exam. Colposcopy today for LGSIL/ HPV+  P: Annual breast exam with colposcopy today.  Melodye Ped, NP 09/02/2022 12:41 PM

## 2022-09-03 LAB — SURGICAL PATHOLOGY

## 2022-09-08 ENCOUNTER — Telehealth: Payer: Self-pay

## 2022-09-08 NOTE — Telephone Encounter (Signed)
-----   Message from [place] sent at 09/08/2022 12:13 PM EST -----  ----- Message ----- From: Interface, Lab In Three Zero One Sent: 09/03/2022   6:58 PM EST To: Claretha Cooper, CMA

## 2022-09-08 NOTE — Telephone Encounter (Signed)
Using [employer], Rudene Anda, pt has been advised of her COLPO results and advised to repeat her PAP on one year.

## 2023-08-17 ENCOUNTER — Other Ambulatory Visit: Payer: Self-pay | Admitting: *Deleted

## 2023-08-17 DIAGNOSIS — Z124 Encounter for screening for malignant neoplasm of cervix: Secondary | ICD-10-CM

## 2023-08-17 DIAGNOSIS — N898 Other specified noninflammatory disorders of vagina: Secondary | ICD-10-CM

## 2023-08-17 NOTE — Patient Instructions (Signed)
Marland Kitchen

## 2023-08-17 NOTE — Progress Notes (Signed)
Patient: Briana Shelton           Date of Birth: 14-Sep-1990           MRN: [id number] Visit Date: 08/17/2023 PCP: Patient, No Pcp Per  Cervical Cancer Screening Do you smoke?: No Have you ever had or been told you have an allergy to latex products?: No Marital status: Single Date of last pap smear: 1-2 yrs ago Date of last menstrual period: 07/17/23 Number of pregnancies: 0 Number of births: 2 Have you ever had any of the following? Hysterectomy: No Tubal ligation (tubes tied): No Abnormal bleeding: No Abnormal pap smear: Yes Venereal warts: No A sex partner with venereal warts: No A high risk* sex partner: No  Cervical Exam  Abnormal Observations: Thick white yeast appearing discharge observed on cervix and vagina. Wet prep completed.  Recommendations: Pap smear completed today. Last Pap smear was 06/29/2022 at [facility] and LSIL with positive HPV 16 that a colposcopy was completed for follow up that showed blood in fibrin that no cervical mucosa was identified. Patient has two other abnormal Pap smears 04/22/2021 at the [facility] clinic that was normal with positive HPV that a colposcopy was completed 07/03/2021 that was benign and 02/09/2020 that was LSIL that a colposcopy was completed 05/13/2020 that showed in CIN-II. All of the above Pap smear and colposcopy results are available in EPIC. Let patient know if today's Pap smear is normal and HPV negative that her next Pap smear will be due in one year due to her history of abnormal Pap smears. Informed patient that follow up with her with results of her Pap smear and wet prep by phone or letter within the next week. Patient verbalized understanding.    Spanish interpreter Briana Shelton from [facility] provided.  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   Mild cervical dysplasia 05/13/2020   Screening breast  examination 07/06/2019   Breast lump on left side at 9 o'clock position 07/06/2019   Indication for care in labor or delivery 01/03/2014   NVD (normal vaginal delivery) 01/03/2014   Past Medical History:  Diagnosis Date   Pregnancy induced hypertension     Family History  Problem Relation Age of Onset   Breast cancer Mother     Social History   Occupational History   Not on file  Tobacco Use   Smoking status: Never   Smokeless tobacco: Never  Vaping Use   Vaping status: Never Used  Substance and Sexual Activity   Alcohol use: Yes    Comment: occasionally   Drug use: No   Sexual activity: Yes    Birth control/protection: Implant

## 2023-08-18 LAB — CERVICOVAGINAL ANCILLARY ONLY
Bacterial Vaginitis (gardnerella): NEGATIVE
Candida Glabrata: NEGATIVE
Candida Vaginitis: NEGATIVE
Comment: NEGATIVE
Comment: NEGATIVE
Comment: NEGATIVE
Comment: NEGATIVE
Trichomonas: NEGATIVE

## 2023-08-21 LAB — CYTOLOGY - PAP
Adequacy: ABSENT
Comment: NEGATIVE
Diagnosis: NEGATIVE
High risk HPV: NEGATIVE

## 2023-08-23 ENCOUNTER — Telehealth: Payer: Self-pay

## 2023-08-23 NOTE — Telephone Encounter (Signed)
Using [employer], Amy ID: [phone], I have spoken with pt and advised of her results. Pt expressed understanding of this information and that she still needs to repeat her PAP in one year given her result hx.

## 2023-09-14 ENCOUNTER — Ambulatory Visit: Payer: Self-pay
